# Patient Record
Sex: Male | Born: 1960 | Race: White | Hispanic: No | Marital: Single | Smoking: Former smoker
Health system: Southern US, Community
[De-identification: ages and names within clinical notes are randomized; demographics above are authoritative.]

## PROBLEM LIST (undated history)

## (undated) DIAGNOSIS — H524 Presbyopia: Secondary | ICD-10-CM

## (undated) DIAGNOSIS — K30 Functional dyspepsia: Secondary | ICD-10-CM

## (undated) DIAGNOSIS — L84 Corns and callosities: Secondary | ICD-10-CM

## (undated) DIAGNOSIS — E559 Vitamin D deficiency, unspecified: Secondary | ICD-10-CM

## (undated) DIAGNOSIS — M79676 Pain in unspecified toe(s): Secondary | ICD-10-CM

## (undated) DIAGNOSIS — R05 Cough: Secondary | ICD-10-CM

## (undated) DIAGNOSIS — B351 Tinea unguium: Secondary | ICD-10-CM

## (undated) DIAGNOSIS — E785 Hyperlipidemia, unspecified: Secondary | ICD-10-CM

## (undated) DIAGNOSIS — R042 Hemoptysis: Secondary | ICD-10-CM

## (undated) DIAGNOSIS — I10 Essential (primary) hypertension: Secondary | ICD-10-CM

## (undated) DIAGNOSIS — J310 Chronic rhinitis: Secondary | ICD-10-CM

## (undated) DIAGNOSIS — R059 Cough, unspecified: Secondary | ICD-10-CM

## (undated) DIAGNOSIS — H0019 Chalazion unspecified eye, unspecified eyelid: Secondary | ICD-10-CM

## (undated) DIAGNOSIS — K122 Cellulitis and abscess of mouth: Secondary | ICD-10-CM

## (undated) DIAGNOSIS — M1990 Unspecified osteoarthritis, unspecified site: Secondary | ICD-10-CM

## (undated) DIAGNOSIS — M2550 Pain in unspecified joint: Secondary | ICD-10-CM

## (undated) DIAGNOSIS — I519 Heart disease, unspecified: Secondary | ICD-10-CM

## (undated) DIAGNOSIS — K219 Gastro-esophageal reflux disease without esophagitis: Secondary | ICD-10-CM

## (undated) DIAGNOSIS — I2510 Atherosclerotic heart disease of native coronary artery without angina pectoris: Secondary | ICD-10-CM

---
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
## 2014-09-18 DIAGNOSIS — I213 ST elevation (STEMI) myocardial infarction of unspecified site: Secondary | ICD-10-CM

## 2014-09-18 HISTORY — PX: CORONARY STENT PLACEMENT: SHX1402

## 2014-09-18 HISTORY — DX: ST elevation (STEMI) myocardial infarction of unspecified site: I21.3

## 2019-02-14 ENCOUNTER — Other Ambulatory Visit: Payer: Self-pay

## 2019-02-14 ENCOUNTER — Inpatient Hospital Stay (HOSPITAL_COMMUNITY)
Admission: EM | Admit: 2019-02-14 | Discharge: 2019-02-17 | DRG: 392 | Attending: Family Medicine | Admitting: Family Medicine

## 2019-02-14 ENCOUNTER — Emergency Department (HOSPITAL_COMMUNITY)

## 2019-02-14 ENCOUNTER — Encounter (HOSPITAL_COMMUNITY): Payer: Self-pay | Admitting: Emergency Medicine

## 2019-02-14 DIAGNOSIS — K219 Gastro-esophageal reflux disease without esophagitis: Secondary | ICD-10-CM | POA: Diagnosis present

## 2019-02-14 DIAGNOSIS — Z79899 Other long term (current) drug therapy: Secondary | ICD-10-CM

## 2019-02-14 DIAGNOSIS — K5792 Diverticulitis of intestine, part unspecified, without perforation or abscess without bleeding: Secondary | ICD-10-CM | POA: Diagnosis present

## 2019-02-14 DIAGNOSIS — Z7902 Long term (current) use of antithrombotics/antiplatelets: Secondary | ICD-10-CM

## 2019-02-14 DIAGNOSIS — D62 Acute posthemorrhagic anemia: Secondary | ICD-10-CM | POA: Diagnosis present

## 2019-02-14 DIAGNOSIS — Z955 Presence of coronary angioplasty implant and graft: Secondary | ICD-10-CM

## 2019-02-14 DIAGNOSIS — R0602 Shortness of breath: Secondary | ICD-10-CM

## 2019-02-14 DIAGNOSIS — Z87891 Personal history of nicotine dependence: Secondary | ICD-10-CM

## 2019-02-14 DIAGNOSIS — K5732 Diverticulitis of large intestine without perforation or abscess without bleeding: Secondary | ICD-10-CM | POA: Diagnosis not present

## 2019-02-14 DIAGNOSIS — K6389 Other specified diseases of intestine: Secondary | ICD-10-CM | POA: Diagnosis present

## 2019-02-14 DIAGNOSIS — Z7982 Long term (current) use of aspirin: Secondary | ICD-10-CM

## 2019-02-14 DIAGNOSIS — I2510 Atherosclerotic heart disease of native coronary artery without angina pectoris: Secondary | ICD-10-CM | POA: Diagnosis present

## 2019-02-14 DIAGNOSIS — I10 Essential (primary) hypertension: Secondary | ICD-10-CM | POA: Diagnosis present

## 2019-02-14 DIAGNOSIS — I252 Old myocardial infarction: Secondary | ICD-10-CM

## 2019-02-14 DIAGNOSIS — D509 Iron deficiency anemia, unspecified: Secondary | ICD-10-CM | POA: Diagnosis present

## 2019-02-14 DIAGNOSIS — Z20828 Contact with and (suspected) exposure to other viral communicable diseases: Secondary | ICD-10-CM | POA: Diagnosis present

## 2019-02-14 DIAGNOSIS — E785 Hyperlipidemia, unspecified: Secondary | ICD-10-CM | POA: Diagnosis present

## 2019-02-14 DIAGNOSIS — D649 Anemia, unspecified: Secondary | ICD-10-CM | POA: Diagnosis present

## 2019-02-14 HISTORY — DX: Chronic rhinitis: J31.0

## 2019-02-14 HISTORY — DX: Functional dyspepsia: K30

## 2019-02-14 HISTORY — DX: Presbyopia: H52.4

## 2019-02-14 HISTORY — DX: Chalazion unspecified eye, unspecified eyelid: H00.19

## 2019-02-14 HISTORY — DX: Gastro-esophageal reflux disease without esophagitis: K21.9

## 2019-02-14 HISTORY — DX: Pain in unspecified toe(s): M79.676

## 2019-02-14 HISTORY — DX: Corns and callosities: L84

## 2019-02-14 HISTORY — DX: Tinea unguium: B35.1

## 2019-02-14 HISTORY — DX: Vitamin D deficiency, unspecified: E55.9

## 2019-02-14 HISTORY — DX: Heart disease, unspecified: I51.9

## 2019-02-14 HISTORY — DX: Hyperlipidemia, unspecified: E78.5

## 2019-02-14 HISTORY — DX: Essential (primary) hypertension: I10

## 2019-02-14 HISTORY — DX: Pain in unspecified joint: M25.50

## 2019-02-14 HISTORY — DX: Atherosclerotic heart disease of native coronary artery without angina pectoris: I25.10

## 2019-02-14 HISTORY — DX: Hemoptysis: R04.2

## 2019-02-14 HISTORY — DX: Cough, unspecified: R05.9

## 2019-02-14 HISTORY — DX: Unspecified osteoarthritis, unspecified site: M19.90

## 2019-02-14 HISTORY — DX: Cellulitis and abscess of mouth: K12.2

## 2019-02-14 LAB — COMPREHENSIVE METABOLIC PANEL
ALT: 17 U/L (ref 0–44)
AST: 21 U/L (ref 15–41)
Albumin: 3.6 g/dL (ref 3.5–5.0)
Alkaline Phosphatase: 98 U/L (ref 38–126)
Anion gap: 12 (ref 5–15)
BUN: 21 mg/dL — ABNORMAL HIGH (ref 6–20)
CO2: 23 mmol/L (ref 22–32)
Calcium: 9.4 mg/dL (ref 8.9–10.3)
Chloride: 102 mmol/L (ref 98–111)
Creatinine, Ser: 1.15 mg/dL (ref 0.61–1.24)
GFR calc Af Amer: >60 mL/min (ref >60)
GFR calc non Af Amer: >60 mL/min (ref >60)
Glucose, Bld: 147 mg/dL — ABNORMAL HIGH (ref 70–99)
Potassium: 4.6 mmol/L (ref 3.5–5.1)
Sodium: 137 mmol/L (ref 135–145)
Total Bilirubin: 0.6 mg/dL (ref 0.3–1.2)
Total Protein: 6.7 g/dL (ref 6.5–8.1)

## 2019-02-14 LAB — CBC
HCT: 29.6 % — ABNORMAL LOW (ref 39.0–52.0)
Hemoglobin: 7.9 g/dL — ABNORMAL LOW (ref 13.0–17.0)
MCH: 17.5 pg — ABNORMAL LOW (ref 26.0–34.0)
MCHC: 26.7 g/dL — ABNORMAL LOW (ref 30.0–36.0)
MCV: 65.6 fL — ABNORMAL LOW (ref 80.0–100.0)
Platelets: 384 10*3/uL (ref 150–400)
RBC: 4.51 MIL/uL (ref 4.22–5.81)
RDW: 19 % — ABNORMAL HIGH (ref 11.5–15.5)
WBC: 14.6 10*3/uL — ABNORMAL HIGH (ref 4.0–10.5)
nRBC: 0 % (ref 0.0–0.2)

## 2019-02-14 LAB — TROPONIN I: Troponin I: <0.03 ng/mL (ref <0.03)

## 2019-02-14 NOTE — ED Triage Notes (Signed)
[facility] - pt from [place] [facility]. C/O an episode of SOB, dizziness, nausea, abd pain, and an "odd taste" in his mouth earlier this afternoon. Pt has hx of MI with stent placement in 2016. Lung sounds clear per EMS, BP 100/67. EKG remarkable

## 2019-02-15 ENCOUNTER — Emergency Department (HOSPITAL_COMMUNITY)

## 2019-02-15 ENCOUNTER — Encounter (HOSPITAL_COMMUNITY): Payer: Self-pay

## 2019-02-15 DIAGNOSIS — Z87891 Personal history of nicotine dependence: Secondary | ICD-10-CM | POA: Diagnosis not present

## 2019-02-15 DIAGNOSIS — K5792 Diverticulitis of intestine, part unspecified, without perforation or abscess without bleeding: Secondary | ICD-10-CM

## 2019-02-15 DIAGNOSIS — Z20828 Contact with and (suspected) exposure to other viral communicable diseases: Secondary | ICD-10-CM | POA: Diagnosis present

## 2019-02-15 DIAGNOSIS — Z7982 Long term (current) use of aspirin: Secondary | ICD-10-CM | POA: Diagnosis not present

## 2019-02-15 DIAGNOSIS — K6389 Other specified diseases of intestine: Secondary | ICD-10-CM | POA: Diagnosis present

## 2019-02-15 DIAGNOSIS — Z79899 Other long term (current) drug therapy: Secondary | ICD-10-CM | POA: Diagnosis not present

## 2019-02-15 DIAGNOSIS — E785 Hyperlipidemia, unspecified: Secondary | ICD-10-CM | POA: Diagnosis present

## 2019-02-15 DIAGNOSIS — D62 Acute posthemorrhagic anemia: Secondary | ICD-10-CM | POA: Diagnosis present

## 2019-02-15 DIAGNOSIS — K219 Gastro-esophageal reflux disease without esophagitis: Secondary | ICD-10-CM | POA: Diagnosis present

## 2019-02-15 DIAGNOSIS — D649 Anemia, unspecified: Secondary | ICD-10-CM | POA: Diagnosis not present

## 2019-02-15 DIAGNOSIS — I10 Essential (primary) hypertension: Secondary | ICD-10-CM | POA: Diagnosis present

## 2019-02-15 DIAGNOSIS — K5732 Diverticulitis of large intestine without perforation or abscess without bleeding: Principal | ICD-10-CM

## 2019-02-15 DIAGNOSIS — I252 Old myocardial infarction: Secondary | ICD-10-CM | POA: Diagnosis not present

## 2019-02-15 DIAGNOSIS — I2510 Atherosclerotic heart disease of native coronary artery without angina pectoris: Secondary | ICD-10-CM | POA: Diagnosis present

## 2019-02-15 DIAGNOSIS — Z7902 Long term (current) use of antithrombotics/antiplatelets: Secondary | ICD-10-CM | POA: Diagnosis not present

## 2019-02-15 DIAGNOSIS — D509 Iron deficiency anemia, unspecified: Secondary | ICD-10-CM | POA: Diagnosis present

## 2019-02-15 DIAGNOSIS — Z955 Presence of coronary angioplasty implant and graft: Secondary | ICD-10-CM | POA: Diagnosis not present

## 2019-02-15 LAB — COMPREHENSIVE METABOLIC PANEL
ALT: 16 U/L (ref 0–44)
AST: 20 U/L (ref 15–41)
Albumin: 3.6 g/dL (ref 3.5–5.0)
Alkaline Phosphatase: 105 U/L (ref 38–126)
Anion gap: 11 (ref 5–15)
BUN: 18 mg/dL (ref 6–20)
CO2: 26 mmol/L (ref 22–32)
Calcium: 9.3 mg/dL (ref 8.9–10.3)
Chloride: 102 mmol/L (ref 98–111)
Creatinine, Ser: 0.99 mg/dL (ref 0.61–1.24)
GFR calc Af Amer: >60 mL/min (ref >60)
GFR calc non Af Amer: >60 mL/min (ref >60)
Glucose, Bld: 114 mg/dL — ABNORMAL HIGH (ref 70–99)
Potassium: 3.9 mmol/L (ref 3.5–5.1)
Sodium: 139 mmol/L (ref 135–145)
Total Bilirubin: 0.5 mg/dL (ref 0.3–1.2)
Total Protein: 6.6 g/dL (ref 6.5–8.1)

## 2019-02-15 LAB — URINALYSIS, ROUTINE W REFLEX MICROSCOPIC
Bilirubin Urine: NEGATIVE
Glucose, UA: NEGATIVE mg/dL
Hgb urine dipstick: NEGATIVE
Ketones, ur: NEGATIVE mg/dL
Leukocytes,Ua: NEGATIVE
Nitrite: NEGATIVE
Protein, ur: NEGATIVE mg/dL
Specific Gravity, Urine: 1.01 (ref 1.005–1.030)
pH: 5.5 (ref 5.0–8.0)

## 2019-02-15 LAB — SARS CORONAVIRUS 2 BY RT PCR (HOSPITAL ORDER, PERFORMED IN ~~LOC~~ HOSPITAL LAB): SARS Coronavirus 2: NEGATIVE

## 2019-02-15 LAB — IRON AND TIBC
Iron: 12 ug/dL — ABNORMAL LOW (ref 45–182)
Saturation Ratios: 3 % — ABNORMAL LOW (ref 17.9–39.5)
TIBC: 446 ug/dL (ref 250–450)
UIBC: 434 ug/dL

## 2019-02-15 LAB — OCCULT BLOOD X 1 CARD TO LAB, STOOL: Fecal Occult Bld: POSITIVE — AB

## 2019-02-15 LAB — RETICULOCYTES
Immature Retic Fract: 27.6 % — ABNORMAL HIGH (ref 2.3–15.9)
RBC.: 4.44 MIL/uL (ref 4.22–5.81)
Retic Count, Absolute: 74.6 10*3/uL (ref 19.0–186.0)
Retic Ct Pct: 1.7 % (ref 0.4–3.1)

## 2019-02-15 LAB — TYPE AND SCREEN
ABO/RH(D): A POS
Antibody Screen: NEGATIVE

## 2019-02-15 LAB — VITAMIN B12: Vitamin B-12: 339 pg/mL (ref 180–914)

## 2019-02-15 LAB — MRSA PCR SCREENING: MRSA by PCR: NEGATIVE

## 2019-02-15 LAB — FERRITIN: Ferritin: 2 ng/mL — ABNORMAL LOW (ref 24–336)

## 2019-02-15 LAB — FOLATE: Folate: 13.1 ng/mL (ref >5.9)

## 2019-02-15 MED ORDER — ONDANSETRON HCL 4 MG/2ML IJ SOLN: 4.0000 mg | Freq: Four times a day (QID) | INTRAMUSCULAR | Status: DC | PRN

## 2019-02-15 MED ORDER — ASPIRIN 81 MG PO CHEW
81.0000 mg | CHEWABLE_TABLET | Freq: Every day | ORAL | Status: DC
  Administered 2019-02-15 – 2019-02-17 (×3): 81 mg via ORAL
  Filled 2019-02-15 (×3): qty 1

## 2019-02-15 MED ORDER — METOPROLOL SUCCINATE ER 25 MG PO TB24
25.0000 mg | ORAL_TABLET | Freq: Every day | ORAL | Status: DC
  Administered 2019-02-15 – 2019-02-17 (×3): 25 mg via ORAL
  Filled 2019-02-15 (×3): qty 1

## 2019-02-15 MED ORDER — ATORVASTATIN CALCIUM 40 MG PO TABS
80.0000 mg | ORAL_TABLET | Freq: Every day | ORAL | Status: DC
  Administered 2019-02-15 – 2019-02-17 (×3): 80 mg via ORAL
  Filled 2019-02-15 (×3): qty 2

## 2019-02-15 MED ORDER — CLOPIDOGREL BISULFATE 75 MG PO TABS: 75.0000 mg | ORAL_TABLET | Freq: Every day | ORAL | Status: DC

## 2019-02-15 MED ORDER — SODIUM CHLORIDE 0.9 % IV SOLN
2.0000 g | INTRAVENOUS | Status: DC
  Administered 2019-02-16 – 2019-02-17 (×2): 2 g via INTRAVENOUS
  Filled 2019-02-15 (×2): qty 20

## 2019-02-15 MED ORDER — SODIUM CHLORIDE 0.9 % IV SOLN
INTRAVENOUS | Status: DC
  Administered 2019-02-15: 04:00:00 via INTRAVENOUS
  Administered 2019-02-16 (×2): 75 mL/h via INTRAVENOUS

## 2019-02-15 MED ORDER — METRONIDAZOLE IN NACL 5-0.79 MG/ML-% IV SOLN
500.0000 mg | Freq: Once | INTRAVENOUS | Status: AC
  Administered 2019-02-15: 500 mg via INTRAVENOUS
  Filled 2019-02-15: qty 100

## 2019-02-15 MED ORDER — ACETAMINOPHEN 325 MG PO TABS: 650.0000 mg | ORAL_TABLET | Freq: Four times a day (QID) | ORAL | Status: DC | PRN

## 2019-02-15 MED ORDER — ENSURE ENLIVE PO LIQD
237.0000 mL | Freq: Two times a day (BID) | ORAL | Status: DC
  Administered 2019-02-16 – 2019-02-17 (×3): 237 mL via ORAL

## 2019-02-15 MED ORDER — ONDANSETRON HCL 4 MG PO TABS
4.0000 mg | ORAL_TABLET | Freq: Four times a day (QID) | ORAL | Status: DC | PRN
  Administered 2019-02-16: 4 mg via ORAL
  Filled 2019-02-15: qty 1

## 2019-02-15 MED ORDER — SODIUM CHLORIDE 0.9 % IV SOLN
2.0000 g | Freq: Once | INTRAVENOUS | Status: AC
  Administered 2019-02-15: 2 g via INTRAVENOUS
  Filled 2019-02-15: qty 20

## 2019-02-15 MED ORDER — IOHEXOL 300 MG/ML  SOLN
100.0000 mL | Freq: Once | INTRAMUSCULAR | Status: AC | PRN
  Administered 2019-02-15: 100 mL via INTRAVENOUS

## 2019-02-15 MED ORDER — METRONIDAZOLE IN NACL 5-0.79 MG/ML-% IV SOLN
500.0000 mg | Freq: Three times a day (TID) | INTRAVENOUS | Status: DC
  Administered 2019-02-15 – 2019-02-17 (×6): 500 mg via INTRAVENOUS
  Filled 2019-02-15 (×7): qty 100

## 2019-02-15 MED ORDER — ENOXAPARIN SODIUM 40 MG/0.4ML ~~LOC~~ SOLN: 40.0000 mg | SUBCUTANEOUS | Status: DC

## 2019-02-15 MED ORDER — SPIRONOLACTONE 25 MG PO TABS
25.0000 mg | ORAL_TABLET | Freq: Every day | ORAL | Status: DC
  Administered 2019-02-15 – 2019-02-17 (×3): 25 mg via ORAL
  Filled 2019-02-15 (×3): qty 1

## 2019-02-15 MED ORDER — ISOSORBIDE DINITRATE 20 MG PO TABS
30.0000 mg | ORAL_TABLET | Freq: Four times a day (QID) | ORAL | Status: DC
  Administered 2019-02-15 – 2019-02-17 (×7): 30 mg via ORAL
  Filled 2019-02-15 (×8): qty 2

## 2019-02-15 NOTE — ED Notes (Signed)
ED Provider at bedside. 

## 2019-02-15 NOTE — Progress Notes (Signed)
Patient seen and evaluated, chart reviewed, please see EMR for updated orders. Please see full H&P dictated by admitting physician Dr Darrick Meigs for same date of service.    CT Abd/Pelvis IMPRESSION: 1. Short-segment wall thickening in mid sigmoid colon, with minimal pericolonic hazy opacity. Differential diagnosis includes sigmoid diverticulitis and colon carcinoma. Consider further evaluation with colonoscopy or short-term follow-up CT in 3-4 weeks. 2. No evidence of abscess or bowel obstruction.    D/w Gi service... Plan is to treat diverticulitis with antibiotics for now with possible endoluminal evaluation/colonoscopy in 3 to 4 weeks--- continue IV Rocephin and Flagyl    Patient seen and evaluated, chart reviewed, please see EMR for updated orders. Please see full H&P dictated by admitting physician Dr Darrick Meigs for same date of service.

## 2019-02-15 NOTE — ED Provider Notes (Signed)
[facility] EMERGENCY DEPARTMENT Provider Note   CSN: [id number] Arrival date & time: 02/14/19  2203    History   Chief Complaint Chief Complaint  Patient presents with  . Shortness of Breath    HPI Walter Allen is a 58 y.o. male.     The history is provided by the patient.  Abdominal Pain  Pain location:  LLQ and RLQ Pain quality: aching   Pain severity:  Moderate Onset quality:  Sudden Duration:  8 hours Timing:  Constant Progression:  Worsening Chronicity:  New Relieved by:  Nothing Worsened by:  Nothing Associated symptoms: cough, diarrhea, nausea and shortness of breath   Associated symptoms: no chest pain, no hematemesis, no hematochezia and no vomiting   [facility].  He has a previous history of CAD. He reports approximately 8 hours ago he had onset of abdominal cramping and diarrhea.  He also reported shortness of breath and felt clammy.  No chest pain.  He does report cough. He continues to have abdominal pain.  Past Medical History:  Diagnosis Date  . Abscess of mouth   . Arthritis   . Cellulitis of mouth   . Chalazion   . Chronic rhinitis   . Corns and callosities   . Cough   . Functional dyspepsia   . Heart disease    severe left ventricular systolic dysfunction after anterior MI  . Hemoptysis   . Hyperlipidemia   . Hypertension   . Joint pain   . Presbyopia   . STEMI (ST elevation myocardial infarction) ([place])   . Tinea unguium   . Toe pain   . Vitamin D deficiency     There are no active problems to display for this patient.   Past Surgical History:  Procedure Laterality Date  . CORONARY STENT PLACEMENT          Home Medications    Prior to Admission medications   Medication Sig Start Date End Date Taking? Authorizing Provider  acetaminophen (TYLENOL) 325 MG tablet Take 650 mg by mouth every 6 (six) hours as needed.   Yes [provider]  aspirin 81 MG chewable tablet Chew by mouth daily.   Yes  [provider]  atorvastatin (LIPITOR) 80 MG tablet Take 80 mg by mouth daily.   Yes [provider]  cetirizine (ZYRTEC) 10 MG tablet Take 10 mg by mouth daily.   Yes [provider]  clopidogrel (PLAVIX) 75 MG tablet Take 75 mg by mouth daily.   Yes [provider]  furosemide (LASIX) 20 MG tablet Take 20 mg by mouth.   Yes [provider]  isosorbide dinitrate (ISORDIL) 30 MG tablet Take 30 mg by mouth 4 (four) times daily.   Yes [provider]  metoprolol succinate (TOPROL-XL) 25 MG 24 hr tablet Take 25 mg by mouth daily.   Yes [provider]  nitroGLYCERIN (NITROSTAT) 0.4 MG SL tablet Place 0.4 mg under the tongue every 5 (five) minutes as needed for chest pain.   Yes [provider]  omeprazole (PRILOSEC) 20 MG capsule Take 20 mg by mouth daily.   Yes [provider]  spironolactone (ALDACTONE) 25 MG tablet Take 25 mg by mouth daily.   Yes [provider]  triamcinolone (KENALOG) 0.147 MG/GM topical spray Apply topically 2 (two) times daily.   Yes [provider]    Family History No family history on file.  Social History Social History   Tobacco Use  .  Smoking status: Former Smoker    Types: Cigarettes    Last attempt to quit: 02/13/2006    Years since quitting: 13.0  Substance Use Topics  . Alcohol use: Never    Frequency: Never  . Drug use: Never     Allergies   Collard greens [lactuca virosa]   Review of Systems Review of Systems  Respiratory: Positive for cough and shortness of breath.   Cardiovascular: Negative for chest pain.  Gastrointestinal: Positive for abdominal pain, diarrhea and nausea. Negative for blood in stool, hematemesis, hematochezia and vomiting.  All other systems reviewed and are negative.    Physical Exam Updated Vital Signs BP 104/69   Pulse 72   Temp 98.3 F (36.8 C) (Oral)   Resp 14   Ht 1.753 m (5\' 9" )   Wt 93.9 kg   SpO2 99%    BMI 30.57 kg/m   Physical Exam CONSTITUTIONAL: Elderly, pale HEAD: Normocephalic/atraumatic EYES: EOMI/PERRL, conjunctiva pale ENMT: Mucous membranes moist NECK: supple no meningeal signs SPINE/BACK:entire spine nontender CV: S1/S2 noted, no murmurs/rubs/gallops noted LUNGS: Lungs are clear to auscultation bilaterally, no apparent distress ABDOMEN: soft, mild LLQ and RLQ tenderness, no rebound or guarding, bowel sounds noted throughout abdomen GU:no cva tenderness Rectal-brown stool noted, no blood or melena, Hemoccult negative, nurse present NEURO: Pt is awake/alert/appropriate, moves all extremitiesx4.  No facial droop.   EXTREMITIES: pulses normal/equal, full ROM SKIN: Pale PSYCH: no abnormalities of mood noted, alert and oriented to situation   ED Treatments / Results  Labs (all labs ordered are listed, but only abnormal results are displayed) Labs Reviewed  CBC - Abnormal; Notable for the following components:      Result Value   WBC 14.6 (*)    Hemoglobin 7.9 (*)    HCT 29.6 (*)    MCV 65.6 (*)    MCH 17.5 (*)    MCHC 26.7 (*)    RDW 19.0 (*)    All other components within normal limits  COMPREHENSIVE METABOLIC PANEL - Abnormal; Notable for the following components:   Glucose, Bld 147 (*)    BUN 21 (*)    All other components within normal limits  SARS CORONAVIRUS 2 (HOSPITAL ORDER, [facility])  TROPONIN I  URINALYSIS, ROUTINE W REFLEX MICROSCOPIC  TYPE AND SCREEN    EKG EKG Interpretation  Date/Time:  Friday Feb 14 2019 22:12:09 EDT Ventricular Rate:  71 PR Interval:    QRS Duration: 93 QT Interval:  387 QTC Calculation: 421 R Axis:   35 Text Interpretation:  Sinus rhythm No previous ECGs available Confirmed by Ripley Fraise [phone]) on 02/14/2019 11:08:58 PM   Radiology Ct Abdomen Pelvis W Contrast  Result Date: 02/15/2019 CLINICAL DATA:  Lower abdominal pain and nausea beginning today. EXAM: CT ABDOMEN AND PELVIS WITH  CONTRAST TECHNIQUE: Multidetector CT imaging of the abdomen and pelvis was performed using the standard protocol following bolus administration of intravenous contrast. CONTRAST:  164mL OMNIPAQUE IOHEXOL 300 MG/ML  SOLN COMPARISON:  None. FINDINGS: Lower Chest: No acute findings. Hepatobiliary: No hepatic masses identified. Gallbladder is unremarkable. Pancreas:  No mass or inflammatory changes. Spleen: Within normal limits in size and appearance. Adrenals/Urinary Tract: No masses identified. No evidence of hydronephrosis. Stomach/Bowel: A short-segment area of wall thickening is seen in the mid sigmoid colon with minimal hazy opacity in the adjacent pericolonic fat. This could represent sigmoid diverticulitis or colon carcinoma. No evidence of abscess or bowel obstruction. Vascular/Lymphatic: No pathologically enlarged lymph nodes.  No abdominal aortic aneurysm. Aortic atherosclerosis. Reproductive:  No mass or other significant abnormality. Other:  None. Musculoskeletal:  No suspicious bone lesions identified. IMPRESSION: 1. Short-segment wall thickening in mid sigmoid colon, with minimal pericolonic hazy opacity. Differential diagnosis includes sigmoid diverticulitis and colon carcinoma. Consider further evaluation with colonoscopy or short-term follow-up CT in 3-4 weeks. 2. No evidence of abscess or bowel obstruction. Aortic Atherosclerosis (ICD10-I70.0). Electronically Signed   By: Earle Gell M.D.   On: 02/15/2019 01:52   Dg Chest Port 1 View  Result Date: 02/14/2019 CLINICAL DATA:  Shortness of breath and dizziness EXAM: PORTABLE CHEST 1 VIEW COMPARISON:  None. FINDINGS: The heart size and mediastinal contours are within normal limits. Both lungs are clear. The visualized skeletal structures are unremarkable. IMPRESSION: No active disease. Electronically Signed   By: Donavan Foil M.D.   On: 02/14/2019 23:25    Procedures Procedures (including critical care time)  Medications Ordered in ED  Medications - No data to display   Initial Impression / Assessment and Plan / ED Course  I have reviewed the triage vital signs and the nursing notes.  Pertinent labs & imaging results that were available during my care of the patient were reviewed by me and considered in my medical decision making (see chart for details).        12:11 AM Patient presents from prison for abdominal pain, diarrhea and feeling short of breath.  He appears to be anemic, unclear of chronicity.  We have no old records on patient he was unaware of this.  No signs of acute GI bleed.  Though he does still have abdominal pain, will proceed with CT imaging 2:59 AM Pt found to have diverticulitis vs colon mass In the setting of possible infection as well as acute anemia, I feel patient would benefit from admission He will likely need colonoscopy once his infection is treated.  His anemia can be monitored in the hospital. Patient is agreeable with plan.  Apparently he can get specialty care through the prison after discharge  Discussed with Dr. Darrick Meigs for admission Final Clinical Impressions(s) / ED Diagnoses   Final diagnoses:  SOB (shortness of breath)  Acute anemia  Sigmoid diverticulitis    ED Discharge Orders    None       Ripley Fraise, MD 02/15/19 0300

## 2019-02-15 NOTE — ED Notes (Signed)
Patient transported to CT 

## 2019-02-15 NOTE — Consult Note (Signed)
Referring Provider: Dr. Darrick Meigs Primary Care Physician:  Patient, No Pcp Per Primary Gastroenterologist:  Dr. Gala Romney   Date of Admission: 02/14/19 Date of Consultation: 02/15/19  Reason for Consultation:  Diverticulitis  HPI:  Walter Allen is a 58 y.o. year old male who presented to the ED from [employer] [facility] due to abdominal pain, diarrhea. CT abd/pelvis with contrast with short-segment wall thickening in mid sigmoid colon with minimal pericolonic hazy opacity, with differentials to include diverticulitis, unable to exclude carcinoma. Found to have IDA with Hgb 7.9, ferritin 2. Leukocytosis with WBC count 14.6.   States around 415 yesterday, he became dizzy, diaphoretic. Felt constipated. Had been dealing with constipation for about 4 days prior, then started having diarrhea. Associated lower abdomina pain, intermittent and relieved with BM. Stool with dark pink tinge today. Watery. Pain across lower abdomen, acute onset. Pain improved until needs to have a BM, then will have pressure and pain.  One stool every 1-2 hours. Watery with small pieces.  No prior issues with constipation. +nausea no vomiting. Nausea resolved now. No fever.   Having leg cramps recently. Around 3pm will have a bitter taste in mouth. Not related to eating/drinking. Starts off being bitter and then becomes very sweet.   No family history of colon cancer. No family history of colon polyps. Has not spoken to family in 35-40 years. No prior colonoscopy.    Chronic GERD: was taking Prilosec prior to admission. No dysphagia. No unexplained weight loss or lack of appetite.   He states returning for outpatient colonoscopy should not be difficult as long as this is recommended. Guard at bedside.   Past Medical History:  Diagnosis Date  . Abscess of mouth   . Arthritis   . Cellulitis of mouth   . Chalazion   . Chronic rhinitis   . Corns and callosities   . Coronary artery disease   . Cough   . Functional  dyspepsia   . GERD (gastroesophageal reflux disease)   . Heart disease    severe left ventricular systolic dysfunction after anterior MI  . Hemoptysis   . Hyperlipidemia   . Hypertension   . Joint pain   . Presbyopia   . STEMI (ST elevation myocardial infarction) ([place])   . Tinea unguium   . Toe pain   . Vitamin D deficiency     Past Surgical History:  Procedure Laterality Date  . CORONARY STENT PLACEMENT  2016    Prior to Admission medications   Medication Sig Start Date End Date Taking? Authorizing Provider  acetaminophen (TYLENOL) 325 MG tablet Take 650 mg by mouth every 6 (six) hours as needed.   Yes [provider]  aspirin 81 MG chewable tablet Chew by mouth daily.   Yes [provider]  atorvastatin (LIPITOR) 80 MG tablet Take 80 mg by mouth daily.   Yes [provider]  cetirizine (ZYRTEC) 10 MG tablet Take 10 mg by mouth daily.   Yes [provider]  clopidogrel (PLAVIX) 75 MG tablet Take 75 mg by mouth daily.   Yes [provider]  furosemide (LASIX) 20 MG tablet Take 20 mg by mouth.   Yes [provider]  isosorbide dinitrate (ISORDIL) 30 MG tablet Take 30 mg by mouth 4 (four) times daily.   Yes [provider]  metoprolol succinate (TOPROL-XL) 25 MG 24 hr tablet Take 25 mg by mouth daily.   Yes [provider]  nitroGLYCERIN (NITROSTAT) 0.4 MG SL tablet Place  0.4 mg under the tongue every 5 (five) minutes as needed for chest pain.   Yes [provider]  omeprazole (PRILOSEC) 20 MG capsule Take 20 mg by mouth daily.   Yes [provider]  spironolactone (ALDACTONE) 25 MG tablet Take 25 mg by mouth daily.   Yes [provider]  triamcinolone (KENALOG) 0.147 MG/GM topical spray Apply topically 2 (two) times daily.   Yes [provider]    Current Facility-Administered Medications  Medication Dose Route Frequency Provider Last Rate Last Dose  . 0.9 %  sodium  chloride infusion   Intravenous Continuous Emokpae, Courage, MD 75 mL/hr at 02/15/19 1236    . acetaminophen (TYLENOL) tablet 650 mg  650 mg Oral Q6H PRN Oswald Hillock, MD      . aspirin chewable tablet 81 mg  81 mg Oral Daily Oswald Hillock, MD   81 mg at 02/15/19 1232  . atorvastatin (LIPITOR) tablet 80 mg  80 mg Oral Daily Oswald Hillock, MD   80 mg at 02/15/19 1228  . [START ON 02/16/2019] cefTRIAXone (ROCEPHIN) 2 g in sodium chloride 0.9 % 100 mL IVPB  2 g Intravenous Q24H [place], Gagan S, MD      . isosorbide dinitrate (ISORDIL) tablet 30 mg  30 mg Oral QID Oswald Hillock, MD   30 mg at 02/15/19 1229  . metoprolol succinate (TOPROL-XL) 24 hr tablet 25 mg  25 mg Oral Daily Oswald Hillock, MD   25 mg at 02/15/19 1237  . metroNIDAZOLE (FLAGYL) IVPB 500 mg  500 mg Intravenous Q8H Lama, Marge Duncans, MD      . ondansetron (ZOFRAN) tablet 4 mg  4 mg Oral Q6H PRN Oswald Hillock, MD       Or  . ondansetron (ZOFRAN) injection 4 mg  4 mg Intravenous Q6H PRN Oswald Hillock, MD      . spironolactone (ALDACTONE) tablet 25 mg  25 mg Oral Daily Oswald Hillock, MD   25 mg at 02/15/19 1232    Allergies as of 02/14/2019 - never reviewed  Allergen Reaction Noted  . Collard greens [lactuca virosa]  02/14/2019    Family History  Problem Relation Age of Onset  . Colon cancer Neg Hx   . Colon polyps Neg Hx     Social History   Socioeconomic History  . Marital status: Single    Spouse name: Not on file  . Number of children: Not on file  . Years of education: Not on file  . Highest education level: Some college, no degree  Occupational History  . Not on file  Social Needs  . Financial resource strain: Not on file  . Food insecurity:    Worry: Not on file    Inability: Not on file  . Transportation needs:    Medical: Not on file    Non-medical: Not on file  Tobacco Use  . Smoking status: Former Smoker    Types: Cigarettes    Last attempt to quit: 01/22/2006    Years since quitting: 13.0  . Smokeless  tobacco: Never Used  Substance and Sexual Activity  . Alcohol use: Never    Frequency: Never  . Drug use: Never  . Sexual activity: Never  Lifestyle  . Physical activity:    Days per week: Not on file    Minutes per session: Not on file  . Stress: Not on file  Relationships  . Social connections:    Talks on  phone: Not on file    Gets together: Not on file    Attends religious service: Not on file    Active member of club or organization: Not on file    Attends meetings of clubs or organizations: Not on file    Relationship status: Not on file  . Intimate partner violence:    Fear of current or ex partner: Not on file    Emotionally abused: Not on file    Physically abused: Not on file    Forced sexual activity: Not on file  Other Topics Concern  . Not on file  Social History Narrative  . Not on file    Review of Systems: Gen: see HPI CV: Denies chest pain, heart palpitations, syncope, edema  Resp: Denies shortness of breath with rest, cough, wheezing GI: see HPI GU : Denies urinary burning, urinary frequency, urinary incontinence.  MS: Denies joint pain,swelling, cramping Derm: Denies rash, itching, dry skin Psych: Baseline depression/anxiety Heme: see HPI  Physical Exam: Vital signs in last 24 hours: Temp:  [98 F (36.7 C)-98.3 F (36.8 C)] 98 F (36.7 C) (05/30 0411) Pulse Rate:  [58-72] 67 (05/30 0411) Resp:  [14-20] 16 (05/30 0411) BP: (95-126)/(64-77) 126/75 (05/30 0411) SpO2:  [98 %-100 %] 100 % (05/30 0411) Weight:  [93.9 kg] 93.9 kg (05/29 2207) Last BM Date: 02/15/19 General:   Alert,  Well-developed, well-nourished, pleasant and cooperative in NAD Head:  Normocephalic and atraumatic. Eyes:  Sclera clear, no icterus.   Conjunctiva pink. Ears:  Normal auditory acuity. Nose:  No deformity, discharge,  or lesions. Mouth:  No deformity or lesions, dentition normal. Lungs:  Clear throughout to auscultation.    Heart:  S1 S2 present, possible soft  systolic murmur  Abdomen:  Soft, mild TTP lower abdomen and nondistended. No masses, hepatosplenomegaly or hernias noted. Normal bowel sounds, without guarding, and without rebound.   Rectal:  Deferred until time of colonoscopy.   Msk:  Symmetrical without gross deformities. Normal posture. Extremities:  Without edema. Neurologic:  Alert and  oriented x4 Psych:  Alert and cooperative. Normal mood and affect.  Intake/Output from previous day: 05/29 0701 - 05/30 0700 In: 377.9 [I.V.:178.7; IV Piggyback:199.3] Out: -  Intake/Output this shift: No intake/output data recorded.  Lab Results: Recent Labs    02/14/19 2234  WBC 14.6*  HGB 7.9*  HCT 29.6*  PLT 384   BMET Recent Labs    02/14/19 2234 02/15/19 0627  NA 137 139  K 4.6 3.9  CL 102 102  CO2 23 26  GLUCOSE 147* 114*  BUN 21* 18  CREATININE 1.15 0.99  CALCIUM 9.4 9.3   LFT Recent Labs    02/14/19 2234 02/15/19 0627  PROT 6.7 6.6  ALBUMIN 3.6 3.6  AST 21 20  ALT 17 16  ALKPHOS 98 105  BILITOT 0.6 0.5    Studies/Results: Ct Abdomen Pelvis W Contrast  Result Date: 02/15/2019 CLINICAL DATA:  Lower abdominal pain and nausea beginning today. EXAM: CT ABDOMEN AND PELVIS WITH CONTRAST TECHNIQUE: Multidetector CT imaging of the abdomen and pelvis was performed using the standard protocol following bolus administration of intravenous contrast. CONTRAST:  18mL OMNIPAQUE IOHEXOL 300 MG/ML  SOLN COMPARISON:  None. FINDINGS: Lower Chest: No acute findings. Hepatobiliary: No hepatic masses identified. Gallbladder is unremarkable. Pancreas:  No mass or inflammatory changes. Spleen: Within normal limits in size and appearance. Adrenals/Urinary Tract: No masses identified. No evidence of hydronephrosis. Stomach/Bowel: A short-segment area of wall thickening is seen  in the mid sigmoid colon with minimal hazy opacity in the adjacent pericolonic fat. This could represent sigmoid diverticulitis or colon carcinoma. No evidence of  abscess or bowel obstruction. Vascular/Lymphatic: No pathologically enlarged lymph nodes. No abdominal aortic aneurysm. Aortic atherosclerosis. Reproductive:  No mass or other significant abnormality. Other:  None. Musculoskeletal:  No suspicious bone lesions identified. IMPRESSION: 1. Short-segment wall thickening in mid sigmoid colon, with minimal pericolonic hazy opacity. Differential diagnosis includes sigmoid diverticulitis and colon carcinoma. Consider further evaluation with colonoscopy or short-term follow-up CT in 3-4 weeks. 2. No evidence of abscess or bowel obstruction. Aortic Atherosclerosis (ICD10-I70.0). Electronically Signed   By: Earle Gell M.D.   On: 02/15/2019 01:52   Dg Chest Port 1 View  Result Date: 02/14/2019 CLINICAL DATA:  Shortness of breath and dizziness EXAM: PORTABLE CHEST 1 VIEW COMPARISON:  None. FINDINGS: The heart size and mediastinal contours are within normal limits. Both lungs are clear. The visualized skeletal structures are unremarkable. IMPRESSION: No active disease. Electronically Signed   By: Donavan Foil M.D.   On: 02/14/2019 23:25    Impression: 58 year old male presenting with abdominal pain, loose stool after bout with constipation, and CT noting short segment wall thickening in mid sigmoid colon and started on IV antibiotics for diverticulitis. Found to have microcytic anemia, with Hgb 7.9 and ferritin 2, consistent with IDA. Will need diagnostic colonoscopy once acute illness resolved to assess for occult neoplasm. For now, continue IV antibiotics, clear liquid diet and likely advance to soft diet in next 24 hours.   Plan: Continue IV antibiotics Clear liquids Follow H/H Colonoscopy as outpatient in next 3-4 weeks Likely will advance diet tomorrow Reassess in the morning   Annitta Needs, PhD, ANP-BC [facility] Gastro[facility]     LOS: 0 days    02/15/2019, 12:47 PM

## 2019-02-15 NOTE — ED Notes (Signed)
hospitalist at bedside

## 2019-02-15 NOTE — Plan of Care (Signed)
  Problem: Elimination: Goal: Will not experience complications related to urinary retention Outcome: Not Progressing   Problem: Nutrition: Goal: Adequate nutrition will be maintained Outcome: Not Progressing   Problem: Clinical Measurements: Goal: Cardiovascular complication will be avoided Outcome: Not Progressing

## 2019-02-15 NOTE — H&P (Addendum)
TRH H&P    Patient Demographics:    Walter Allen, is a 58 y.o. male  MRN: [id number]  DOB - November 27, 1960  Admit Date - 02/14/2019  Referring MD/NP/PA: Dr. Christy Gentles  Outpatient Primary MD for the patient is Patient, No Pcp Per  Patient coming from: [facility]  Chief complaint-abdominal pain   HPI:    Walter Allen  is a 58 y.o. male, with a history of CAD status post stent placement in 2016, hyperlipidemia, hypertension who is currently in prison was brought to the ED with chief complaint of abdominal pain and diarrhea which started around 4 PM yesterday.  Patient says that he was constipated for past few days and today he had multiple small BMs and after that he developed diarrhea.  He had 7-8 loose BMs today.  Denies blood in the stool.  Denies vomiting but only complaint of nausea.  Denies fever or chills. Also had shortness of breath and sweating episode. Denies chest pain. Denies weight loss. No previous history of cancer. Never had colonoscopy in the past. In the ED CT scan of the abdomen pelvis showed short segment wall thickening in the mid sigmoid colon with minimal pericolonic hazy opacity.  Patient started on ceftriaxone and Flagyl. CBC showed hemoglobin 7.9.     Review of systems:    In addition to the HPI above,    All other systems reviewed and are negative.    Past History of the following :    Past Medical History:  Diagnosis Date  . Abscess of mouth   . Arthritis   . Cellulitis of mouth   . Chalazion   . Chronic rhinitis   . Corns and callosities   . Cough   . Functional dyspepsia   . Heart disease    severe left ventricular systolic dysfunction after anterior MI  . Hemoptysis   . Hyperlipidemia   . Hypertension   . Joint pain   . Presbyopia   . STEMI (ST elevation myocardial infarction) ([place])   . Tinea unguium   . Toe pain   . Vitamin D deficiency       Past  Surgical History:  Procedure Laterality Date  . CORONARY STENT PLACEMENT        Social History:      Social History   Tobacco Use  . Smoking status: Former Smoker    Types: Cigarettes    Last attempt to quit: 02/13/2006    Years since quitting: 13.0  Substance Use Topics  . Alcohol use: Never    Frequency: Never       Family History :   Grandparents from both father mother side had heart disease.   Home Medications:   Prior to Admission medications   Medication Sig Start Date End Date Taking? Authorizing Provider  acetaminophen (TYLENOL) 325 MG tablet Take 650 mg by mouth every 6 (six) hours as needed.   Yes [provider]  aspirin 81 MG chewable tablet Chew by mouth daily.   Yes [provider]  atorvastatin (LIPITOR) 80 MG  tablet Take 80 mg by mouth daily.   Yes [provider]  cetirizine (ZYRTEC) 10 MG tablet Take 10 mg by mouth daily.   Yes [provider]  clopidogrel (PLAVIX) 75 MG tablet Take 75 mg by mouth daily.   Yes [provider]  furosemide (LASIX) 20 MG tablet Take 20 mg by mouth.   Yes [provider]  isosorbide dinitrate (ISORDIL) 30 MG tablet Take 30 mg by mouth 4 (four) times daily.   Yes [provider]  metoprolol succinate (TOPROL-XL) 25 MG 24 hr tablet Take 25 mg by mouth daily.   Yes [provider]  nitroGLYCERIN (NITROSTAT) 0.4 MG SL tablet Place 0.4 mg under the tongue every 5 (five) minutes as needed for chest pain.   Yes [provider]  omeprazole (PRILOSEC) 20 MG capsule Take 20 mg by mouth daily.   Yes [provider]  spironolactone (ALDACTONE) 25 MG tablet Take 25 mg by mouth daily.   Yes [provider]  triamcinolone (KENALOG) 0.147 MG/GM topical spray Apply topically 2 (two) times daily.   Yes [provider]     Allergies:     Allergies  Allergen Reactions  . [place] Rosanna Randy Virosa]      Physical Exam:    Vitals  Blood pressure 114/75, pulse 64, temperature 98.3 F (36.8 C), temperature source Oral, resp. rate 18, height 5\' 9"  (1.753 m), weight 93.9 kg, SpO2 100 %.  1.  General: Appears in no acute distress  2. Psychiatric: Alert, oriented x3, intact insight and judgment  3. Neurologic: Cranial nerves II through XII grossly intact, motor strength 5/5 in all extremities.  4. HEENMT:  Atraumatic normocephalic, extraocular muscles intact  5. Respiratory : Clear to auscultation bilaterally  6. Cardiovascular : S1-S2, regular, no murmur auscultated, no edema noted in the lower extremities  7. Gastrointestinal:  Abdomen is soft, tenderness in the left lower quadrant, no palpable masses.      Data Review:    CBC Recent Labs  Lab 02/14/19 2234  WBC 14.6*  HGB 7.9*  HCT 29.6*  PLT 384  MCV 65.6*  MCH 17.5*  MCHC 26.7*  RDW 19.0*   ------------------------------------------------------------------------------------------------------------------  Results for orders placed or performed during the hospital encounter of 02/14/19 (from the past 48 hour(s))  CBC     Status: Abnormal   Collection Time: 02/14/19 10:34 PM  Result Value Ref Range   WBC 14.6 (H) 4.0 - 10.5 K/uL   RBC 4.51 4.22 - 5.81 MIL/uL   Hemoglobin 7.9 (L) 13.0 - 17.0 g/dL    Comment: Reticulocyte Hemoglobin testing may be clinically indicated, consider ordering this additional test [id number]    HCT 29.6 (L) 39.0 - 52.0 %   MCV 65.6 (L) 80.0 - 100.0 fL   MCH 17.5 (L) 26.0 - 34.0 pg   MCHC 26.7 (L) 30.0 - 36.0 g/dL   RDW 19.0 (H) 11.5 - 15.5 %   Platelets 384 150 - 400 K/uL   nRBC 0.0 0.0 - 0.2 %    Comment: Performed at [facility] [address] [place], [place] [address]  Comprehensive metabolic panel     Status: Abnormal   Collection Time: 02/14/19 10:34 PM  Result Value Ref Range   Sodium 137 135 - 145 mmol/L   Potassium 4.6 3.5 - 5.1 mmol/L   Chloride 102 98 - 111 mmol/L   CO2 23 22 - 32  mmol/L   Glucose, Bld 147 (H) 70 - 99  mg/dL   BUN 21 (H) 6 - 20 mg/dL   Creatinine, Ser 1.15 0.61 - 1.24 mg/dL   Calcium 9.4 8.9 - 10.3 mg/dL   Total Protein 6.7 6.5 - 8.1 g/dL   Albumin 3.6 3.5 - 5.0 g/dL   AST 21 15 - 41 U/L   ALT 17 0 - 44 U/L   Alkaline Phosphatase 98 38 - 126 U/L   Total Bilirubin 0.6 0.3 - 1.2 mg/dL   GFR calc non Af Amer >60 >60 mL/min   GFR calc Af Amer >60 >60 mL/min   Anion gap 12 5 - 15    Comment: Performed at [facility], [address], [place], [place] [address]  Troponin I - ONCE - STAT     Status: None   Collection Time: 02/14/19 10:34 PM  Result Value Ref Range   Troponin I <0.03 <0.03 ng/mL    Comment: Performed at [facility] [address], [place], [place] [address]  Type and screen     Status: None   Collection Time: 02/15/19 12:24 AM  Result Value Ref Range   ABO/RH(D) A POS    Antibody Screen NEG    Sample Expiration      02/18/2019,2359 Performed at [facility], [address], [place], [place] [address]   Urinalysis, Routine w reflex microscopic     Status: None   Collection Time: 02/15/19  1:29 AM  Result Value Ref Range   Color, Urine YELLOW YELLOW   APPearance CLEAR CLEAR   Specific Gravity, Urine 1.010 1.005 - 1.030   pH 5.5 5.0 - 8.0   Glucose, UA NEGATIVE NEGATIVE mg/dL   Hgb urine dipstick NEGATIVE NEGATIVE   Bilirubin Urine NEGATIVE NEGATIVE   Ketones, ur NEGATIVE NEGATIVE mg/dL   Protein, ur NEGATIVE NEGATIVE mg/dL   Nitrite NEGATIVE NEGATIVE   Leukocytes,Ua NEGATIVE NEGATIVE    Comment: Microscopic not done on urines with negative protein, blood, leukocytes, nitrite, or glucose < 500 mg/dL. Performed at [facility], [address], [place], [place] [address]     Chemistries  Recent Labs  Lab 02/14/19 2234  NA 137  K 4.6  CL 102  CO2 23  GLUCOSE 147*  BUN 21*  CREATININE 1.15  CALCIUM 9.4  AST 21  ALT 17  ALKPHOS 98  BILITOT 0.6    ------------------------------------------------------------------------------------------------------------------  ------------------------------------------------------------------------------------------------------------------ GFR: Estimated Creatinine Clearance: 77.3 mL/min (by C-G formula based on SCr of 1.15 mg/dL). Liver Function Tests: Recent Labs  Lab 02/14/19 2234  AST 21  ALT 17  ALKPHOS 98  BILITOT 0.6  PROT 6.7  ALBUMIN 3.6   No results for input(s): LIPASE, AMYLASE in the last 168 hours. No results for input(s): AMMONIA in the last 168 hours. Coagulation Profile: No results for input(s): INR, PROTIME in the last 168 hours. Cardiac Enzymes: Recent Labs  Lab 02/14/19 2234  TROPONINI <0.03   BNP (last 3 results) No results for input(s): PROBNP in the last 8760 hours. HbA1C: No results for input(s): HGBA1C in the last 72 hours. CBG: No results for input(s): GLUCAP in the last 168 hours. Lipid Profile: No results for input(s): CHOL, HDL, LDLCALC, TRIG, CHOLHDL, LDLDIRECT in the last 72 hours. Thyroid Function Tests: No results for input(s): TSH, T4TOTAL, FREET4, T3FREE, THYROIDAB in the last 72 hours. Anemia Panel: No results for input(s): VITAMINB12, FOLATE, FERRITIN, TIBC, IRON, RETICCTPCT in the last 72 hours.  --------------------------------------------------------------------------------------------------------------- Urine analysis:    Component Value Date/Time   COLORURINE YELLOW 02/15/2019 0129  APPEARANCEUR CLEAR 02/15/2019 0129   LABSPEC 1.010 02/15/2019 0129   PHURINE 5.5 02/15/2019 0129   GLUCOSEU NEGATIVE 02/15/2019 0129   HGBUR NEGATIVE 02/15/2019 0129   BILIRUBINUR NEGATIVE 02/15/2019 0129   KETONESUR NEGATIVE 02/15/2019 0129   PROTEINUR NEGATIVE 02/15/2019 0129   NITRITE NEGATIVE 02/15/2019 0129   LEUKOCYTESUR NEGATIVE 02/15/2019 0129      Imaging Results:    Ct Abdomen Pelvis W Contrast  Result Date: 02/15/2019 CLINICAL DATA:   Lower abdominal pain and nausea beginning today. EXAM: CT ABDOMEN AND PELVIS WITH CONTRAST TECHNIQUE: Multidetector CT imaging of the abdomen and pelvis was performed using the standard protocol following bolus administration of intravenous contrast. CONTRAST:  180mL OMNIPAQUE IOHEXOL 300 MG/ML  SOLN COMPARISON:  None. FINDINGS: Lower Chest: No acute findings. Hepatobiliary: No hepatic masses identified. Gallbladder is unremarkable. Pancreas:  No mass or inflammatory changes. Spleen: Within normal limits in size and appearance. Adrenals/Urinary Tract: No masses identified. No evidence of hydronephrosis. Stomach/Bowel: A short-segment area of wall thickening is seen in the mid sigmoid colon with minimal hazy opacity in the adjacent pericolonic fat. This could represent sigmoid diverticulitis or colon carcinoma. No evidence of abscess or bowel obstruction. Vascular/Lymphatic: No pathologically enlarged lymph nodes. No abdominal aortic aneurysm. Aortic atherosclerosis. Reproductive:  No mass or other significant abnormality. Other:  None. Musculoskeletal:  No suspicious bone lesions identified. IMPRESSION: 1. Short-segment wall thickening in mid sigmoid colon, with minimal pericolonic hazy opacity. Differential diagnosis includes sigmoid diverticulitis and colon carcinoma. Consider further evaluation with colonoscopy or short-term follow-up CT in 3-4 weeks. 2. No evidence of abscess or bowel obstruction. Aortic Atherosclerosis (ICD10-I70.0). Electronically Signed   By: Earle Gell M.D.   On: 02/15/2019 01:52   Dg Chest Port 1 View  Result Date: 02/14/2019 CLINICAL DATA:  Shortness of breath and dizziness EXAM: PORTABLE CHEST 1 VIEW COMPARISON:  None. FINDINGS: The heart size and mediastinal contours are within normal limits. Both lungs are clear. The visualized skeletal structures are unremarkable. IMPRESSION: No active disease. Electronically Signed   By: Donavan Foil M.D.   On: 02/14/2019 23:25    My personal  review of EKG: Rhythm NSR   Assessment & Plan:    Active Problems:   Diverticulitis   1. Sigmoid diverticulitis-CT scan shows short segment wall thickening in mid sigmoid colon with minimal pericolonic hazy opacity.  Concern for diverticulitis and colon carcinoma.  Will start ceftriaxone 2 g IV every 24 hours, Flagyl 500 mg IV every 8 hours.  Start clear liquid diet.  There is a concern for colon cancer, as patient also has anemia with hemoglobin 7.9.  Will consult GI.  Patient will need colonoscopy after treatment of sigmoid diverticulitis.  2. Anemia-MCV 65, hemoglobin is 7.9, likely has iron deficiency anemia.  Will check FOBT, anemia panel  3. CAD status post stent placement-continue Imdur, aspirin, Plavix, Toprol-XL.   DVT Prophylaxis- SCDs  AM Labs Ordered, also please review Full Orders  Family Communication: Admission, patients condition and plan of care including tests being ordered have been discussed with the patient  who indicate understanding and agree with the plan and Code Status.  Code Status: Full code  Admission status:Inpatient: Based on patients clinical presentation and evaluation of above clinical data, I have made determination that patient meets Inpatient criteria at this time.  Time spent in minutes : 60 minutes   Oswald Hillock M.D on 02/15/2019 at 3:16 AM

## 2019-02-15 NOTE — Progress Notes (Signed)
Nutrition Brief Note  Patient identified on the Malnutrition Screening Tool (MST) Report. He had reported unintentional weight loss in absence of any appetite/intake changes.   RD operating remotely on weekends. Attempted to reach pt at two separate times of day via phone, but unsuccessful. There is a paucity of chart history to go off of. Will continue Ensure Enlive until more more information is available to guide interventions  No nutrition interventions warranted at this time. If nutrition issues arise, please consult RD.   Burtis Junes RD, LDN, CNSC Clinical Nutrition Available Tues-Sat via Pager: [phone] 02/15/2019 5:38 PM

## 2019-02-16 DIAGNOSIS — I2510 Atherosclerotic heart disease of native coronary artery without angina pectoris: Secondary | ICD-10-CM | POA: Diagnosis present

## 2019-02-16 DIAGNOSIS — D649 Anemia, unspecified: Secondary | ICD-10-CM | POA: Diagnosis present

## 2019-02-16 LAB — BASIC METABOLIC PANEL
Anion gap: 10 (ref 5–15)
BUN: 8 mg/dL (ref 6–20)
CO2: 24 mmol/L (ref 22–32)
Calcium: 8.8 mg/dL — ABNORMAL LOW (ref 8.9–10.3)
Chloride: 105 mmol/L (ref 98–111)
Creatinine, Ser: 0.94 mg/dL (ref 0.61–1.24)
GFR calc Af Amer: >60 mL/min (ref >60)
GFR calc non Af Amer: >60 mL/min (ref >60)
Glucose, Bld: 108 mg/dL — ABNORMAL HIGH (ref 70–99)
Potassium: 3.8 mmol/L (ref 3.5–5.1)
Sodium: 139 mmol/L (ref 135–145)

## 2019-02-16 LAB — CBC
HCT: 26.9 % — ABNORMAL LOW (ref 39.0–52.0)
Hemoglobin: 7.1 g/dL — ABNORMAL LOW (ref 13.0–17.0)
MCH: 17.7 pg — ABNORMAL LOW (ref 26.0–34.0)
MCHC: 26.4 g/dL — ABNORMAL LOW (ref 30.0–36.0)
MCV: 66.9 fL — ABNORMAL LOW (ref 80.0–100.0)
Platelets: 362 10*3/uL (ref 150–400)
RBC: 4.02 MIL/uL — ABNORMAL LOW (ref 4.22–5.81)
RDW: 19 % — ABNORMAL HIGH (ref 11.5–15.5)
WBC: 6.7 10*3/uL (ref 4.0–10.5)
nRBC: 0 % (ref 0.0–0.2)

## 2019-02-16 LAB — HIV ANTIBODY (ROUTINE TESTING W REFLEX): HIV Screen 4th Generation wRfx: NONREACTIVE

## 2019-02-16 NOTE — Progress Notes (Signed)
Subjective: Abdominal pain improved. Frequent stool resolved. Noted small amount of red mucus with wiping. Feels much better. Tolerating full liquids. Would like to advance diet.   Objective: Vital signs in last 24 hours: Temp:  [98.4 F (36.9 C)-98.6 F (37 C)] 98.4 F (36.9 C) (05/31 0602) Pulse Rate:  [61-71] 63 (05/31 0602) Resp:  [17] 17 (05/31 0602) BP: (100-106)/(70-72) 100/70 (05/31 0602) SpO2:  [94 %-98 %] 98 % (05/31 0602) Last BM Date: 02/15/19 General:   Alert and oriented, pleasant Abdomen:  Bowel sounds present, soft, non-tender, non-distended. No HSM or hernias noted. No rebound or guarding. No masses appreciated  Neurologic:  Alert and  oriented X 4  Intake/Output from previous day: 05/30 0701 - 05/31 0700 In: 3195 [P.O.:980; I.V.:1915; IV Piggyback:300] Out: -  Intake/Output this shift: No intake/output data recorded.  Lab Results: Recent Labs    02/14/19 2234 02/16/19 0630  WBC 14.6* 6.7  HGB 7.9* 7.1*  HCT 29.6* 26.9*  PLT 384 362   BMET Recent Labs    02/14/19 2234 02/15/19 0627 02/16/19 0630  NA 137 139 139  K 4.6 3.9 3.8  CL 102 102 105  CO2 23 26 24   GLUCOSE 147* 114* 108*  BUN 21* 18 8  CREATININE 1.15 0.99 0.94  CALCIUM 9.4 9.3 8.8*   LFT Recent Labs    02/14/19 2234 02/15/19 0627  PROT 6.7 6.6  ALBUMIN 3.6 3.6  AST 21 20  ALT 17 16  ALKPHOS 98 105  BILITOT 0.6 0.5     Studies/Results: Ct Abdomen Pelvis W Contrast  Result Date: 02/15/2019 CLINICAL DATA:  Lower abdominal pain and nausea beginning today. EXAM: CT ABDOMEN AND PELVIS WITH CONTRAST TECHNIQUE: Multidetector CT imaging of the abdomen and pelvis was performed using the standard protocol following bolus administration of intravenous contrast. CONTRAST:  156mL OMNIPAQUE IOHEXOL 300 MG/ML  SOLN COMPARISON:  None. FINDINGS: Lower Chest: No acute findings. Hepatobiliary: No hepatic masses identified. Gallbladder is unremarkable. Pancreas:  No mass or inflammatory  changes. Spleen: Within normal limits in size and appearance. Adrenals/Urinary Tract: No masses identified. No evidence of hydronephrosis. Stomach/Bowel: A short-segment area of wall thickening is seen in the mid sigmoid colon with minimal hazy opacity in the adjacent pericolonic fat. This could represent sigmoid diverticulitis or colon carcinoma. No evidence of abscess or bowel obstruction. Vascular/Lymphatic: No pathologically enlarged lymph nodes. No abdominal aortic aneurysm. Aortic atherosclerosis. Reproductive:  No mass or other significant abnormality. Other:  None. Musculoskeletal:  No suspicious bone lesions identified. IMPRESSION: 1. Short-segment wall thickening in mid sigmoid colon, with minimal pericolonic hazy opacity. Differential diagnosis includes sigmoid diverticulitis and colon carcinoma. Consider further evaluation with colonoscopy or short-term follow-up CT in 3-4 weeks. 2. No evidence of abscess or bowel obstruction. Aortic Atherosclerosis (ICD10-I70.0). Electronically Signed   By: Earle Gell M.D.   On: 02/15/2019 01:52   Dg Chest Port 1 View  Result Date: 02/14/2019 CLINICAL DATA:  Shortness of breath and dizziness EXAM: PORTABLE CHEST 1 VIEW COMPARISON:  None. FINDINGS: The heart size and mediastinal contours are within normal limits. Both lungs are clear. The visualized skeletal structures are unremarkable. IMPRESSION: No active disease. Electronically Signed   By: Donavan Foil M.D.   On: 02/14/2019 23:25    Assessment: 58 year old male presenting with abdominal pain, loose stool after bout with constipation, and CT noting short segment wall thickening in mid sigmoid colon and started on IV antibiotics for diverticulitis. Found to have microcytic anemia, with Hgb  7.9 and ferritin 2, consistent with IDA. Hgb 7.1 today. Clinically, he feels much improved s/p supportive measures. Scant amount of blood with wiping likely benign anorectal source.   Will need diagnostic colonoscopy  once acute illness resolved to assess for occult neoplasm. He is asking for a tentative date prior to discharge, as he will be returning to the [place] and will need to show necessity for procedure.   Discussed with hospitalist. IV antibiotics continue today but transition to oral tomorrow. Will increase diet to low-residue.   Plan: Low-residue diet Continue IV antibiotics and transition to oral tomorrow  Hopeful discharge in next 24 hours if continues to improve Will need outpatient diagnostic colonoscopy in next 3-4 weeks Will reassess tomorrow  Annitta Needs, PhD, ANP-BC [facility]    LOS: 1 day    02/16/2019, 11:03 AM

## 2019-02-16 NOTE — Progress Notes (Addendum)
Patient Demographics:    Walter Allen, is a 58 y.o. male, DOB - 01/10/1961, BDZ:[id number]  Admit date - 02/14/2019   Admitting Physician Oswald Hillock, MD  Outpatient Primary MD for the patient is Patient, No Pcp Per  LOS - 1   Chief Complaint  Patient presents with  . Shortness of Breath        Subjective:    Walter Allen today has no fevers, no emesis,  No chest pain, had loose stools with some mucus and pink/blood tinged secretion  Assessment  & Plan :    Principal Problem:   Acute sigmoid diverticulitis--need to rule out malignancy/mass Active Problems:   Acute anemia Vs chronic anemia with superimposed acute blood loss anemia   CAD (coronary artery disease)/Stents in 2016   CT Abd/Pelvis IMPRESSION: 1. Short-segment wall thickening in mid sigmoid colon, with minimal pericolonic hazy opacity. Differential diagnosis includes sigmoid diverticulitis and colon carcinoma. Consider further evaluation with colonoscopy or short-term follow-up CT in 3-4 weeks. 2. No evidence of abscess or bowel obstruction.  Brief Summary 58 y.o. male with a history of CAD status post stent placement in 2016, hyperlipidemia, hypertension admitted on 02/15/2019 from breathing with abdominal pain and diarrhea and found to have sigmoid diverticulitis with possible underlying colorectal mass  A/p 1) acute diverticulitis of the sigmoid colon ---D/w Gi service...  There is a concern about underlying malignancy , okay to treat diverticulitis with antibiotics for now with possible endoluminal evaluation/colonoscopy in 3 to 4 weeks--- .Marland Kitchen Patient has not had colonoscopy previously . continue IV Rocephin and Flagyl,... White count is down to 6.7 from 14.6  2)CAD----asymptomatic, no ACS type symptoms, coronary stents were placed in 2016, okay to stop Plavix but continue aspirin, metoprolol and Imdur  3)Anemia-- Acute on  chronic versus chronic-----suspect some component of superimposed acute blood loss anemia, hypochromic and microcytic anemia, with  ferritin 2, serum iron is 12 with iron saturation of 3 , B12 and folate are not low disease consistent with IDA. Hgb 7.1 today from 7.9 yesterday, monitor H&H and transfuse as clinically indicated  Disposition/Need for in-Hospital Stay- patient unable to be discharged at this time due to acute diverticulitis requiring IV antibiotics, acute blood loss anemia with hemoglobin trending down.... Patient may need transfusion or endoluminal evaluation if anemia worsens  Code Status : Full  Family Communication:   na  Disposition Plan  : [facility] in 1 to 2 days to improve  Consults  :  Gi  DVT Prophylaxis  :   - SCDs /Gi Bleed  Lab Results  Component Value Date   PLT 362 02/16/2019    Inpatient Medications  Scheduled Meds: . aspirin  81 mg Oral Daily  . atorvastatin  80 mg Oral Daily  . feeding supplement (ENSURE ENLIVE)  237 mL Oral BID BM  . isosorbide dinitrate  30 mg Oral QID  . metoprolol succinate  25 mg Oral Daily  . spironolactone  25 mg Oral Daily   Continuous Infusions: . sodium chloride 75 mL/hr (02/16/19 0519)  . cefTRIAXone (ROCEPHIN)  IV 2 g (02/16/19 0008)  . metronidazole 500 mg (02/16/19 1111)   PRN Meds:.acetaminophen, ondansetron **OR** ondansetron (ZOFRAN) IV    Anti-infectives (From admission, onward)  Start     Dose/Rate Route Frequency Ordered Stop   02/16/19 0100  cefTRIAXone (ROCEPHIN) 2 g in sodium chloride 0.9 % 100 mL IVPB     2 g 200 mL/hr over 30 Minutes Intravenous Every 24 hours 02/15/19 0359     02/15/19 1200  metroNIDAZOLE (FLAGYL) IVPB 500 mg     500 mg 100 mL/hr over 60 Minutes Intravenous Every 8 hours 02/15/19 0359     02/15/19 0215  cefTRIAXone (ROCEPHIN) 2 g in sodium chloride 0.9 % 100 mL IVPB     2 g 200 mL/hr over 30 Minutes Intravenous  Once 02/15/19 0209 02/15/19 2139   02/15/19 0215   metroNIDAZOLE (FLAGYL) IVPB 500 mg     500 mg 100 mL/hr over 60 Minutes Intravenous  Once 02/15/19 0209 02/15/19 0403        Objective:   Vitals:   02/15/19 2017 02/15/19 2233 02/16/19 0602 02/16/19 1540  BP:  104/72 100/70 103/67  Pulse:  61 63 68  Resp:  17 17 20   Temp:  98.6 F (37 C) 98.4 F (36.9 C) 98.7 F (37.1 C)  TempSrc:  Oral Oral Oral  SpO2: 98% 97% 98% 98%  Weight:      Height:        Wt Readings from Last 3 Encounters:  02/14/19 93.9 kg     Intake/Output Summary (Last 24 hours) at 02/16/2019 1545 Last data filed at 02/16/2019 3614 Gross per 24 hour  Intake 3195.03 ml  Output -  Net 3195.03 ml     Physical Exam Patient is examined daily including today on 02/16/19 , exams remain the same as of yesterday except that has changed   Gen:- Awake Alert,  In no apparent distress  HEENT:- [place].AT, No sclera icterus Neck-Supple Neck,No JVD,.  Lungs-  CTAB , fair symmetrical air movement CV- S1, S2 normal, regular  Abd-  +ve B.Sounds, Abd Soft, lower quadrant/lower abdominal tenderness slightly improved, no rebound or guarding  extremity/Skin:- No  edema, pedal pulses present  Psych-affect is appropriate, oriented x3 Neuro-no new focal deficits, no tremors   Data Review:   Micro Results Recent Results (from the past 240 hour(s))  SARS Coronavirus 2 (CEPHEID - Performed in [place] hospital lab), Hosp Order     Status: None   Collection Time: 02/15/19  2:34 AM  Result Value Ref Range Status   SARS Coronavirus 2 NEGATIVE NEGATIVE Final    Comment: (NOTE) If result is NEGATIVE SARS-CoV-2 target nucleic acids are NOT DETECTED. The SARS-CoV-2 RNA is generally detectable in upper and lower  respiratory specimens during the acute phase of infection. The lowest  concentration of SARS-CoV-2 viral copies this assay can detect is 250  copies / mL. A negative result does not preclude SARS-CoV-2 infection  and should not be used as the sole basis for treatment or  other  patient management decisions.  A negative result may occur with  improper specimen collection / handling, submission of specimen other  than nasopharyngeal swab, presence of viral mutation(s) within the  areas targeted by this assay, and inadequate number of viral copies  (<250 copies / mL). A negative result must be combined with clinical  observations, patient history, and epidemiological information. If result is POSITIVE SARS-CoV-2 target nucleic acids are DETECTED. The SARS-CoV-2 RNA is generally detectable in upper and lower  respiratory specimens dur ing the acute phase of infection.  Positive  results are indicative of active infection with SARS-CoV-2.  Clinical  correlation  with patient history and other diagnostic information is  necessary to determine patient infection status.  Positive results do  not rule out bacterial infection or co-infection with other viruses. If result is PRESUMPTIVE POSTIVE SARS-CoV-2 nucleic acids MAY BE PRESENT.   A presumptive positive result was obtained on the submitted specimen  and confirmed on repeat testing.  While 2019 novel coronavirus  (SARS-CoV-2) nucleic acids may be present in the submitted sample  additional confirmatory testing may be necessary for epidemiological  and / or clinical management purposes  to differentiate between  SARS-CoV-2 and other Sarbecovirus currently known to infect humans.  If clinically indicated additional testing with an alternate test  methodology [phone]) is advised. The SARS-CoV-2 RNA is generally  detectable in upper and lower respiratory sp ecimens during the acute  phase of infection. The expected result is Negative. Fact Sheet for Patients:  StrictlyIdeas.no Fact Sheet for Healthcare Providers: BankingDealers.co.za This test is not yet approved or cleared by the [place] FDA and has been authorized for detection and/or diagnosis of  SARS-CoV-2 by FDA under an Emergency Use Authorization (EUA).  This EUA will remain in effect (meaning this test can be used) for the duration of the COVID-19 declaration under Section 564(b)(1) of the Act, 21 U.S.C. section 360bbb-3(b)(1), unless the authorization is terminated or revoked sooner. Performed at [facility], [address] [place], [place] [address]   MRSA PCR Screening     Status: None   Collection Time: 02/15/19  4:30 AM  Result Value Ref Range Status   MRSA by PCR NEGATIVE NEGATIVE Final    Comment:        The GeneXpert MRSA Assay (FDA approved for NASAL specimens only), is one component of a comprehensive MRSA colonization surveillance program. It is not intended to diagnose MRSA infection nor to guide or monitor treatment for MRSA infections. Performed at [facility], [address], [place],  [address]     Radiology Reports Ct Abdomen Pelvis W Contrast  Result Date: 02/15/2019 CLINICAL DATA:  Lower abdominal pain and nausea beginning today. EXAM: CT ABDOMEN AND PELVIS WITH CONTRAST TECHNIQUE: Multidetector CT imaging of the abdomen and pelvis was performed using the standard protocol following bolus administration of intravenous contrast. CONTRAST:  136mL OMNIPAQUE IOHEXOL 300 MG/ML  SOLN COMPARISON:  None. FINDINGS: Lower Chest: No acute findings. Hepatobiliary: No hepatic masses identified. Gallbladder is unremarkable. Pancreas:  No mass or inflammatory changes. Spleen: Within normal limits in size and appearance. Adrenals/Urinary Tract: No masses identified. No evidence of hydronephrosis. Stomach/Bowel: A short-segment area of wall thickening is seen in the mid sigmoid colon with minimal hazy opacity in the adjacent pericolonic fat. This could represent sigmoid diverticulitis or colon carcinoma. No evidence of abscess or bowel obstruction. Vascular/Lymphatic: No pathologically enlarged lymph nodes. No abdominal aortic aneurysm. Aortic atherosclerosis.  Reproductive:  No mass or other significant abnormality. Other:  None. Musculoskeletal:  No suspicious bone lesions identified. IMPRESSION: 1. Short-segment wall thickening in mid sigmoid colon, with minimal pericolonic hazy opacity. Differential diagnosis includes sigmoid diverticulitis and colon carcinoma. Consider further evaluation with colonoscopy or short-term follow-up CT in 3-4 weeks. 2. No evidence of abscess or bowel obstruction. Aortic Atherosclerosis (ICD10-I70.0). Electronically Signed   By: Earle Gell M.D.   On: 02/15/2019 01:52   Dg Chest Port 1 View  Result Date: 02/14/2019 CLINICAL DATA:  Shortness of breath and dizziness EXAM: PORTABLE CHEST 1 VIEW COMPARISON:  None. FINDINGS: The heart size and mediastinal contours are within normal limits.  Both lungs are clear. The visualized skeletal structures are unremarkable. IMPRESSION: No active disease. Electronically Signed   By: Donavan Foil M.D.   On: 02/14/2019 23:25     CBC Recent Labs  Lab 02/14/19 2234 02/16/19 0630  WBC 14.6* 6.7  HGB 7.9* 7.1*  HCT 29.6* 26.9*  PLT 384 362  MCV 65.6* 66.9*  MCH 17.5* 17.7*  MCHC 26.7* 26.4*  RDW 19.0* 19.0*    Chemistries  Recent Labs  Lab 02/14/19 2234 02/15/19 0627 02/16/19 0630  NA 137 139 139  K 4.6 3.9 3.8  CL 102 102 105  CO2 23 26 24   GLUCOSE 147* 114* 108*  BUN 21* 18 8  CREATININE 1.15 0.99 0.94  CALCIUM 9.4 9.3 8.8*  AST 21 20  --   ALT 17 16  --   ALKPHOS 98 105  --   BILITOT 0.6 0.5  --    ------------------------------------------------------------------------------------------------------------------ No results for input(s): CHOL, HDL, LDLCALC, TRIG, CHOLHDL, LDLDIRECT in the last 72 hours.  No results found for: HGBA1C ------------------------------------------------------------------------------------------------------------------ No results for input(s): TSH, T4TOTAL, T3FREE, THYROIDAB in the last 72 hours.  Invalid input(s): FREET3  ------------------------------------------------------------------------------------------------------------------ Recent Labs    02/15/19 0627  VITAMINB12 339  FOLATE 13.1  FERRITIN 2*  TIBC 446  IRON 12*  RETICCTPCT 1.7    Coagulation profile No results for input(s): INR, PROTIME in the last 168 hours.  No results for input(s): DDIMER in the last 72 hours.  Cardiac Enzymes Recent Labs  Lab 02/14/19 2234  TROPONINI <0.03   ------------------------------------------------------------------------------------------------------------------ No results found for: BNP   Roxan Hockey M.D on 02/16/2019 at 3:45 PM  Go to [email or web address] - for contact info  [facility] - Office  [phone]

## 2019-02-17 DIAGNOSIS — K5732 Diverticulitis of large intestine without perforation or abscess without bleeding: Secondary | ICD-10-CM

## 2019-02-17 LAB — CBC
HCT: 26.4 % — ABNORMAL LOW (ref 39.0–52.0)
Hemoglobin: 7 g/dL — ABNORMAL LOW (ref 13.0–17.0)
MCH: 17.7 pg — ABNORMAL LOW (ref 26.0–34.0)
MCHC: 26.5 g/dL — ABNORMAL LOW (ref 30.0–36.0)
MCV: 66.7 fL — ABNORMAL LOW (ref 80.0–100.0)
Platelets: 341 10*3/uL (ref 150–400)
RBC: 3.96 MIL/uL — ABNORMAL LOW (ref 4.22–5.81)
RDW: 19.1 % — ABNORMAL HIGH (ref 11.5–15.5)
WBC: 8.1 10*3/uL (ref 4.0–10.5)
nRBC: 0 % (ref 0.0–0.2)

## 2019-02-17 MED ORDER — CEFDINIR 300 MG PO CAPS
300.0000 mg | ORAL_CAPSULE | Freq: Two times a day (BID) | ORAL | Status: DC
  Administered 2019-02-17: 14:00:00 300 mg via ORAL
  Filled 2019-02-17 (×5): qty 1

## 2019-02-17 MED ORDER — METRONIDAZOLE 500 MG PO TABS: 500.0000 mg | ORAL_TABLET | Freq: Three times a day (TID) | ORAL | 0 refills | Status: AC

## 2019-02-17 MED ORDER — CEFDINIR 300 MG PO CAPS: 300.0000 mg | ORAL_CAPSULE | Freq: Two times a day (BID) | ORAL | 0 refills | Status: AC

## 2019-02-17 MED ORDER — ONDANSETRON HCL 4 MG PO TABS: 4.0000 mg | ORAL_TABLET | Freq: Four times a day (QID) | ORAL | 0 refills | Status: DC | PRN

## 2019-02-17 MED ORDER — METRONIDAZOLE 500 MG PO TABS
500.0000 mg | ORAL_TABLET | Freq: Three times a day (TID) | ORAL | Status: DC
  Filled 2019-02-17 (×4): qty 1

## 2019-02-17 MED ORDER — ASPIRIN EC 81 MG PO TBEC: 81.0000 mg | DELAYED_RELEASE_TABLET | Freq: Every day | ORAL | 2 refills | Status: AC

## 2019-02-17 MED ORDER — FERROUS SULFATE 325 (65 FE) MG PO TABS: ORAL_TABLET | ORAL | 3 refills | Status: AC

## 2019-02-17 MED ORDER — OMEPRAZOLE 20 MG PO CPDR: 20.0000 mg | DELAYED_RELEASE_CAPSULE | Freq: Two times a day (BID) | ORAL | 1 refills | Status: AC

## 2019-02-17 NOTE — Progress Notes (Signed)
IV removed, 2x2 gauze and paper tape applied to site, patient tolerated well.  Reviewed AVS with patient who verbalized understanding.  All discharge paper work given to office that was in room with patient.  Patient transported to police car via wheelchair and transported back to the correction facility by the officer.

## 2019-02-17 NOTE — Progress Notes (Signed)
Subjective: Feeling beter today. Pain generally 0-1 unless he is about to have a bowel movement then it increased to 3-4. Last bowel movement yesterday without melena or hematochezia. Denies N/V. No other GI complaints.  Objective: Vital signs in last 24 hours: Temp:  [98.2 F (36.8 C)-98.7 F (37.1 C)] 98.2 F (36.8 C) (06/01 0611) Pulse Rate:  [65-73] 65 (06/01 0611) Resp:  [18-20] 18 (06/01 0611) BP: (103-111)/(67-74) 109/74 (06/01 0611) SpO2:  [97 %-98 %] 98 % (06/01 0611) Last BM Date: 02/16/19 General:   Alert and oriented, pleasant Head:  Normocephalic and atraumatic. Eyes:  No icterus, sclera clear. Conjuctiva pink.  Heart:  S1, S2 present, no murmurs noted.  Lungs: Clear to auscultation bilaterally, without wheezing, rales, or rhonchi.  Abdomen:  Bowel sounds present, soft, non-distended. Mild TTP lower abdomen. No HSM or hernias noted. No rebound or guarding. No masses appreciated  Msk:  Symmetrical without gross deformities. Pulses:  Normal bilateral DP pulses noted. Extremities:  Without clubbing or edema. Neurologic:  Alert and  oriented x4;  grossly normal neurologically. Skin:  Warm and dry, intact without significant lesions.  Psych:  Alert and cooperative. Normal mood and affect.  Intake/Output from previous day: 05/31 0701 - 06/01 0700 In: 2641.7 [P.O.:1240; I.V.:966.5; IV Piggyback:435.2] Out: -  Intake/Output this shift: No intake/output data recorded.  Lab Results: Recent Labs    02/14/19 2234 02/16/19 0630  WBC 14.6* 6.7  HGB 7.9* 7.1*  HCT 29.6* 26.9*  PLT 384 362   BMET Recent Labs    02/14/19 2234 02/15/19 0627 02/16/19 0630  NA 137 139 139  K 4.6 3.9 3.8  CL 102 102 105  CO2 23 26 24   GLUCOSE 147* 114* 108*  BUN 21* 18 8  CREATININE 1.15 0.99 0.94  CALCIUM 9.4 9.3 8.8*   LFT Recent Labs    02/14/19 2234 02/15/19 0627  PROT 6.7 6.6  ALBUMIN 3.6 3.6  AST 21 20  ALT 17 16  ALKPHOS 98 105  BILITOT 0.6 0.5   PT/INR No  results for input(s): LABPROT, INR in the last 72 hours. Hepatitis Panel No results for input(s): HEPBSAG, HCVAB, HEPAIGM, HEPBIGM in the last 72 hours.   Studies/Results: No results found.  Assessment: 58 year old male presenting with abdominal pain, loose stool after bout with constipation, and CT noting short segment wall thickening in mid sigmoid colon and started on IV antibiotics for diverticulitis. Found to have microcytic anemia, with Hgb 7.9 and ferritin 2, consistent with IDA. Hgb 7.1 today. Began feeling progressively better. Scant amount of blood with wiping yesterday likely benign anorectal source.   Will need diagnostic colonoscopy once acute illness resolved to assess for occult neoplasm. He is asking for a tentative date prior to discharge, as he will be returning to the [facility] and will need to show necessity for procedure.   Tentative previous plan: IV antibiotics transition to oral today. Diet increased to low-residue yesterday. Today he's doing well, abdominal pain nearly resolved. No N/V, hematochezia, melena. Tolerating diet well.  Plan: 1. Outpatient follow-up visit scheduled 04/04/2019 at 11:00 am; pending coordination with Prison; alternative: prison physician to see patient for resolution in 4-6 weeks. 2. Tentative date booked for colonoscopy 04/22/19 (8 weeks) at 7:30 am 3. Transition to po antibiotics to complete course 4. Anticipate d/c today   Thank you for allowing [place] to participate in the care of Walter Levy, DNP, AGNP-C Adult & Gerontological Nurse Practitioner [facility]  LOS: 2 days    02/17/2019, 7:49 AM

## 2019-02-17 NOTE — Clinical Social Work Note (Signed)
[place] [employer] was contacted. Spoke with [employer]. Faxed discharge summary to [place] at [phone].     Azari Janssens, Clydene Pugh, LCSW

## 2019-02-17 NOTE — Discharge Instructions (Signed)
1)Stop Plavix, continue baby Aspirin with food 2)Avoid ibuprofen/Advil/Aleve/Motrin/Goody Powders/Naproxen/BC powders/Meloxicam/Diclofenac/Indomethacin and other Nonsteroidal anti-inflammatory medications as these will make you more likely to bleed and can cause stomach ulcers, can also cause Kidney problems.  3)Outpatient follow-up visit with Dr Gala Romney (gastroenterologist at [facility] in [place] , [place]) is scheduled for 04/04/2019 at 11:00 am; pending coordination with Prison; alternative: prison physician to see patient for resolution in 4-6 weeks. 4)Tentative date scheduled for colonoscopy 04/22/19/20 (8 weeks) at 7:30 am 5) take your medications as prescribed 6) please call or return if fevers, increased abdominal pain or rectal bleeding 7) please note that your stools will be dark due to iron pills

## 2019-02-17 NOTE — Discharge Summary (Addendum)
Walter Allen, is a 58 y.o. male  DOB Feb 02, 1961  MRN [id number].  Admission date:  02/14/2019  Admitting Physician  Oswald Hillock, MD  Discharge Date:  02/17/2019   Primary MD  Patient, No Pcp Per  Recommendations for primary care physician for things to follow:   1)Stop Plavix, continue baby Aspirin with food 2)Avoid ibuprofen/Advil/Aleve/Motrin/Goody Powders/Naproxen/BC powders/Meloxicam/Diclofenac/Indomethacin and other Nonsteroidal anti-inflammatory medications as these will make you more likely to bleed and can cause stomach ulcers, can also cause Kidney problems.  3)Outpatient follow-up visit with Dr Gala Romney (gastroenterologist at [facility] in [place] , [place]) is scheduled for 04/04/2019 at 11:00 am; pending coordination with Prison; alternative: prison physician to see patient for resolution in 4-6 weeks. 4)Tentative date scheduled for colonoscopy 04/22/19/20 (8 weeks) at 7:30 am 5) take your medications as prescribed 6) please call or return if fevers, increased abdominal pain or rectal bleeding 7) please note that your stools will be dark due to iron pills  Admission Diagnosis  SOB (shortness of breath) [R06.02] Sigmoid diverticulitis [K57.32] Acute anemia [D64.9]  Discharge Diagnosis  SOB (shortness of breath) [R06.02] Sigmoid diverticulitis [K57.32] Acute anemia [D64.9]    Principal Problem:   Acute sigmoid diverticulitis--need to rule out malignancy/mass Active Problems:   Acute anemia Vs chronic anemia with superimposed acute blood loss anemia   CAD (coronary artery disease)/Stents in 2016   Sigmoid diverticulitis     Past Medical History:  Diagnosis Date   Abscess of mouth    Arthritis    Cellulitis of mouth    Chalazion    Chronic rhinitis    Corns and callosities    Coronary artery disease    Cough    Functional dyspepsia    GERD (gastroesophageal reflux  disease)    Heart disease    severe left ventricular systolic dysfunction after anterior MI   Hemoptysis    Hyperlipidemia    Hypertension    Joint pain    Presbyopia    STEMI (ST elevation myocardial infarction) ([place])    Tinea unguium    Toe pain    Vitamin D deficiency     Past Surgical History:  Procedure Laterality Date   CORONARY STENT PLACEMENT  2016     HPI  from the history and physical done on the day of admission:    Walter Allen  is a 58 y.o. male, with a history of CAD status post stent placement in 2016, hyperlipidemia, hypertension who is currently in prison was brought to the ED with chief complaint of abdominal pain and diarrhea which started around 4 PM yesterday.  Patient says that he was constipated for past few days and today he had multiple small BMs and after that he developed diarrhea.  He had 7-8 loose BMs today.  Denies blood in the stool.  Denies vomiting but only complaint of nausea.  Denies fever or chills. Also had shortness of breath and sweating episode. Denies chest pain. Denies weight loss. No previous history of cancer. Never had colonoscopy in the  past. In the ED CT scan of the abdomen pelvis showed short segment wall thickening in the mid sigmoid colon with minimal pericolonic hazy opacity.  Patient started on ceftriaxone and Flagyl. CBC showed hemoglobin 7.9.    Hospital Course:     CT Abd/PelvisIMPRESSION: 1. Short-segment wall thickening in mid sigmoid colon, with minimal pericolonic hazy opacity. Differential diagnosis includes sigmoid diverticulitis and colon carcinoma. Consider further evaluation with colonoscopy or short-term follow-up CT in 3-4 weeks. 2. No evidence of abscess or bowel obstruction.  Brief Summary 58 y.o.malewith a history of CAD status post stent placement in 2016, hyperlipidemia, hypertension admitted on 02/15/2019 from [facility] with abdominal pain and diarrhea and found to have sigmoid  diverticulitis with possible underlying colorectal mass  A/p 1)Acute Diverticulitis of the Sigmoid colon ---D/w Gi service... There is a concern about underlying malignancy ,  patient was treated for acute diverticulitis with IV Rocephin and IV Flagyl, leukocytosis resolved, abdominal pain resolved.  .. Patient has not had colonoscopy previously .   Will discharge on p.o. Omnicef and Flagyl, Outpatient follow-up visit with Dr Gala Romney (gastroenterologist at [facility] in [place] , [place]) is scheduled for 04/04/2019 at 11:00 am; pending coordination with Prison; alternative: prison physician to see patient for resolution in 4-6 weeks. Tentative date scheduled for colonoscopy 04/22/19/20 (8 weeks) at 7:30 am  2)CAD----asymptomatic, no ACS type symptoms, coronary stents were placed in 2016, okay to stop Plavix , but continue aspirin with food, continue Lipitor, metoprolol and Imdur  3)Iron Deficiency Anemia-- Acute on chronic versus chronic-----suspect some component of superimposed acute blood loss anemia, hypochromic and microcytic anemia, with  ferritin 2, serum iron is 12 with iron saturation of 3 , B12 and folate are not low disease consistent with IDA. Hemoglobin is been around 7 for the last 3 days.  Patient stools are brown.  Okay to discharge home on iron pills--colonoscopic evaluation as above in #1 scheduled   Disposition --- back to correctional facility  Code Status : Full  Family Communication:   na  Disposition Plan  : Back to prison in 1 to 2 days to improve  Consults  :  Gi  Discharge Condition: stable  Follow UP--Outpatient follow-up visit with Dr Gala Romney (gastroenterologist at [facility] in [place] , [place]) is scheduled for 04/04/2019 at 11:00 am; pending coordination with Prison; alternative: prison physician to see patient for resolution in 4-6 weeks. Tentative date scheduled for colonoscopy 04/22/19/20 (8 weeks) at 7:30 am  Diet and Activity recommendation:   As advised  Discharge Instructions    Discharge Instructions    Call MD for:  difficulty breathing, headache or visual disturbances   Complete by:  As directed    Call MD for:  persistant dizziness or light-headedness   Complete by:  As directed    Call MD for:  persistant nausea and vomiting   Complete by:  As directed    Call MD for:  severe uncontrolled pain   Complete by:  As directed    Call MD for:  temperature >100.4   Complete by:  As directed    Diet - low sodium heart healthy   Complete by:  As directed    Discharge instructions   Complete by:  As directed    1)Stop Plavix, continue baby Aspirin with food 2)Avoid ibuprofen/Advil/Aleve/Motrin/Goody Powders/Naproxen/BC powders/Meloxicam/Diclofenac/Indomethacin and other Nonsteroidal anti-inflammatory medications as these will make you more likely to bleed and can cause stomach ulcers, can also cause Kidney problems.  3)Outpatient follow-up  visit with Dr Gala Romney (gastroenterologist at [facility] in [place] , [place]) is scheduled for 04/04/2019 at 11:00 am; pending coordination with Prison; alternative: prison physician to see patient for resolution in 4-6 weeks. 4)Tentative date scheduled for colonoscopy 04/22/19/20 (8 weeks) at 7:30 am 5) take your medications as prescribed 6) please call or return if fevers, increased abdominal pain or rectal bleeding 7) please note that your stools will be dark due to iron pills   Increase activity slowly   Complete by:  As directed        Discharge Medications     Allergies as of 02/17/2019      Reactions   [place] Sink Virosa]       Medication List    STOP taking these medications   aspirin 81 MG chewable tablet Replaced by:  aspirin EC 81 MG tablet   clopidogrel 75 MG tablet Commonly known as:  PLAVIX     TAKE these medications   acetaminophen 325 MG tablet Commonly known as:  TYLENOL Take 650 mg by mouth every 6 (six) hours as needed.   aspirin EC 81 MG  tablet Take 1 tablet (81 mg total) by mouth daily with breakfast. Replaces:  aspirin 81 MG chewable tablet   atorvastatin 80 MG tablet Commonly known as:  LIPITOR Take 80 mg by mouth daily.   cefdinir 300 MG capsule Commonly known as:  OMNICEF Take 1 capsule (300 mg total) by mouth 2 (two) times daily for 7 days.   cetirizine 10 MG tablet Commonly known as:  ZYRTEC Take 10 mg by mouth daily.   ferrous sulfate 325 (65 FE) MG tablet 1 tablet twice a day with meals   furosemide 20 MG tablet Commonly known as:  LASIX Take 20 mg by mouth.   isosorbide dinitrate 30 MG tablet Commonly known as:  ISORDIL Take 30 mg by mouth 4 (four) times daily.   metoprolol succinate 25 MG 24 hr tablet Commonly known as:  TOPROL-XL Take 25 mg by mouth daily.   metroNIDAZOLE 500 MG tablet Commonly known as:  Flagyl Take 1 tablet (500 mg total) by mouth 3 (three) times daily for 7 days.   nitroGLYCERIN 0.4 MG SL tablet Commonly known as:  NITROSTAT Place 0.4 mg under the tongue every 5 (five) minutes as needed for chest pain.   omeprazole 20 MG capsule Commonly known as:  PRILOSEC Take 1 capsule (20 mg total) by mouth 2 (two) times daily before a meal. What changed:  when to take this   ondansetron 4 MG tablet Commonly known as:  ZOFRAN Take 1 tablet (4 mg total) by mouth every 6 (six) hours as needed for nausea or vomiting.   spironolactone 25 MG tablet Commonly known as:  ALDACTONE Take 25 mg by mouth daily.   triamcinolone 0.147 MG/GM topical spray Commonly known as:  KENALOG Apply topically 2 (two) times daily.      Major procedures and Radiology Reports - PLEASE review detailed and final reports for all details, in brief -   Ct Abdomen Pelvis W Contrast  Result Date: 02/15/2019 CLINICAL DATA:  Lower abdominal pain and nausea beginning today. EXAM: CT ABDOMEN AND PELVIS WITH CONTRAST TECHNIQUE: Multidetector CT imaging of the abdomen and pelvis was performed using the standard  protocol following bolus administration of intravenous contrast. CONTRAST:  110mL OMNIPAQUE IOHEXOL 300 MG/ML  SOLN COMPARISON:  None. FINDINGS: Lower Chest: No acute findings. Hepatobiliary: No hepatic masses identified. Gallbladder is unremarkable. Pancreas:  No  mass or inflammatory changes. Spleen: Within normal limits in size and appearance. Adrenals/Urinary Tract: No masses identified. No evidence of hydronephrosis. Stomach/Bowel: A short-segment area of wall thickening is seen in the mid sigmoid colon with minimal hazy opacity in the adjacent pericolonic fat. This could represent sigmoid diverticulitis or colon carcinoma. No evidence of abscess or bowel obstruction. Vascular/Lymphatic: No pathologically enlarged lymph nodes. No abdominal aortic aneurysm. Aortic atherosclerosis. Reproductive:  No mass or other significant abnormality. Other:  None. Musculoskeletal:  No suspicious bone lesions identified. IMPRESSION: 1. Short-segment wall thickening in mid sigmoid colon, with minimal pericolonic hazy opacity. Differential diagnosis includes sigmoid diverticulitis and colon carcinoma. Consider further evaluation with colonoscopy or short-term follow-up CT in 3-4 weeks. 2. No evidence of abscess or bowel obstruction. Aortic Atherosclerosis (ICD10-I70.0). Electronically Signed   By: Earle Gell M.D.   On: 02/15/2019 01:52   Dg Chest Port 1 View  Result Date: 02/14/2019 CLINICAL DATA:  Shortness of breath and dizziness EXAM: PORTABLE CHEST 1 VIEW COMPARISON:  None. FINDINGS: The heart size and mediastinal contours are within normal limits. Both lungs are clear. The visualized skeletal structures are unremarkable. IMPRESSION: No active disease. Electronically Signed   By: Donavan Foil M.D.   On: 02/14/2019 23:25   Micro Results   Recent Results (from the past 240 hour(s))  SARS Coronavirus 2 (CEPHEID - Performed in [place] hospital lab), Hosp Order     Status: None   Collection Time: 02/15/19  2:34 AM    Result Value Ref Range Status   SARS Coronavirus 2 NEGATIVE NEGATIVE Final    Comment: (NOTE) If result is NEGATIVE SARS-CoV-2 target nucleic acids are NOT DETECTED. The SARS-CoV-2 RNA is generally detectable in upper and lower  respiratory specimens during the acute phase of infection. The lowest  concentration of SARS-CoV-2 viral copies this assay can detect is 250  copies / mL. A negative result does not preclude SARS-CoV-2 infection  and should not be used as the sole basis for treatment or other  patient management decisions.  A negative result may occur with  improper specimen collection / handling, submission of specimen other  than nasopharyngeal swab, presence of viral mutation(s) within the  areas targeted by this assay, and inadequate number of viral copies  (<250 copies / mL). A negative result must be combined with clinical  observations, patient history, and epidemiological information. If result is POSITIVE SARS-CoV-2 target nucleic acids are DETECTED. The SARS-CoV-2 RNA is generally detectable in upper and lower  respiratory specimens dur ing the acute phase of infection.  Positive  results are indicative of active infection with SARS-CoV-2.  Clinical  correlation with patient history and other diagnostic information is  necessary to determine patient infection status.  Positive results do  not rule out bacterial infection or co-infection with other viruses. If result is PRESUMPTIVE POSTIVE SARS-CoV-2 nucleic acids MAY BE PRESENT.   A presumptive positive result was obtained on the submitted specimen  and confirmed on repeat testing.  While 2019 novel coronavirus  (SARS-CoV-2) nucleic acids may be present in the submitted sample  additional confirmatory testing may be necessary for epidemiological  and / or clinical management purposes  to differentiate between  SARS-CoV-2 and other Sarbecovirus currently known to infect humans.  If clinically indicated additional  testing with an alternate test  methodology [phone]) is advised. The SARS-CoV-2 RNA is generally  detectable in upper and lower respiratory sp ecimens during the acute  phase of infection. The expected result is  Negative. Fact Sheet for Patients:  StrictlyIdeas.no Fact Sheet for Healthcare Providers: BankingDealers.co.za This test is not yet approved or cleared by the [place] FDA and has been authorized for detection and/or diagnosis of SARS-CoV-2 by FDA under an Emergency Use Authorization (EUA).  This EUA will remain in effect (meaning this test can be used) for the duration of the COVID-19 declaration under Section 564(b)(1) of the Act, 21 U.S.C. section 360bbb-3(b)(1), unless the authorization is terminated or revoked sooner. Performed at [facility], [address], [place], [place] [address]   MRSA PCR Screening     Status: None   Collection Time: 02/15/19  4:30 AM  Result Value Ref Range Status   MRSA by PCR NEGATIVE NEGATIVE Final    Comment:        The GeneXpert MRSA Assay (FDA approved for NASAL specimens only), is one component of a comprehensive MRSA colonization surveillance program. It is not intended to diagnose MRSA infection nor to guide or monitor treatment for MRSA infections. Performed at [facility], [address] [place], [place] [address]    Today   Subjective    Walter Allen today has no new complaints..... Had brown stool, abdominal pain is mostly resolved, tolerating oral intake well, no fevers no chills          Patient has been seen and examined prior to discharge   Objective   Blood pressure 109/74, pulse 65, temperature 98.2 F (36.8 C), temperature source Oral, resp. rate 18, height 5\' 9"  (1.753 m), weight 93.9 kg, SpO2 98 %.   Intake/Output Summary (Last 24 hours) at 02/17/2019 1126 Last data filed at 02/17/2019 0600 Gross per 24 hour  Intake 2641.68 ml  Output --  Net  2641.68 ml   Exam Gen:- Awake Alert,  In no apparent distress  HEENT:- [place].AT, No sclera icterus Neck-Supple Neck,No JVD,.  Lungs-  CTAB , fair symmetrical air movement CV- S1, S2 normal, regular  Abd-  +ve B.Sounds, Abd Soft, lower quadrant/lower abdominal tenderness has improved,  no rebound or guarding  extremity/Skin:- No  edema, pedal pulses present  Psych-affect is appropriate, oriented x3 Neuro-no new focal deficits, no tremors   Data Review   CBC w Diff:  Lab Results  Component Value Date   WBC 8.1 02/17/2019   HGB 7.0 (L) 02/17/2019   HCT 26.4 (L) 02/17/2019   PLT 341 02/17/2019   CMP:  Lab Results  Component Value Date   NA 139 02/16/2019   K 3.8 02/16/2019   CL 105 02/16/2019   CO2 24 02/16/2019   BUN 8 02/16/2019   CREATININE 0.94 02/16/2019   PROT 6.6 02/15/2019   ALBUMIN 3.6 02/15/2019   BILITOT 0.5 02/15/2019   ALKPHOS 105 02/15/2019   AST 20 02/15/2019   ALT 16 02/15/2019  . Outpatient follow-up visit with Dr Gala Romney (gastroenterologist at [facility] in [place] , [place]) is scheduled for 04/04/2019 at 11:00 am; pending coordination with Prison; alternative: prison physician to see patient for resolution in 4-6 weeks. Tentative date scheduled for colonoscopy 04/22/19/20 (8 weeks) at 7:30 am   Total Discharge time is about 33 minutes  Roxan Hockey M.D on 02/17/2019 at 11:26 AM  Go to [email or web address] -  for contact info  [facility] - Office  [phone]

## 2019-02-18 LAB — HEPATITIS C ANTIBODY: HCV Ab: <0.1 s/co ratio (ref 0.0–0.9)

## 2019-04-04 ENCOUNTER — Ambulatory Visit (INDEPENDENT_AMBULATORY_CARE_PROVIDER_SITE_OTHER): Admitting: Nurse Practitioner

## 2019-04-04 ENCOUNTER — Other Ambulatory Visit: Payer: Self-pay

## 2019-04-04 ENCOUNTER — Encounter: Payer: Self-pay | Admitting: Nurse Practitioner

## 2019-04-04 VITALS — BP 109/69 | HR 56 | Temp 98.6°F | Ht 69.0 in | Wt 210.0 lb

## 2019-04-04 DIAGNOSIS — D649 Anemia, unspecified: Secondary | ICD-10-CM

## 2019-04-04 DIAGNOSIS — K5732 Diverticulitis of large intestine without perforation or abscess without bleeding: Secondary | ICD-10-CM | POA: Diagnosis not present

## 2019-04-04 NOTE — Assessment & Plan Note (Signed)
Recent admission for acute sigmoid diverticulitis treated with IV antibiotics and resolved while inpatient.  CT was concerning for possible underlying colorectal mass.  He presents today for follow-up on hospitalization and to schedule colonoscopy.  He does still have some intermittent, brief lower abdominal discomfort typically when he is having a bowel movement that resolves after his bowel movement.  He does admit to several pounds of weight loss of about 7 to 10 pounds over the past few months.  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  His stools are dark/black but he is on iron.  At this point we will proceed with colonoscopy as already scheduled.  I will also check to update his CBC, iron studies because he is not been checked since his discharge.  Follow-up on anemia with the facility.  Proceed with TCS with Dr. Gala Romney in near future: the risks, benefits, and alternatives have been discussed with the patient in detail. The patient states understanding and desires to proceed.  The patient is not on any anticoagulants, anxiolytics, chronic pain medications, or antidepressants.  Conscious sedation should be adequate for his procedure.

## 2019-04-04 NOTE — Progress Notes (Signed)
Referring Provider: No ref. provider found Primary Care Physician:  Patient, No Pcp Per Primary GI:  Dr. Gala Romney  Chief Complaint  Patient presents with  . Diverticulitis    Abd pain,Black stools,F/U from hospital 02/14/2019:Anemia    HPI:   Walter Allen is a 58 y.o. male who presents for hospital follow-up for diverticulitis.  The patient was seen in the hospital from 02/14/2019 through 02/17/2019.  The patient presented with abdominal pain and diarrhea and was found to have sigmoid diverticulitis with possible underlying colorectal mass.  Patient was treated for acute diverticulitis with IV Rocephin and IV Flagyl, leukocytosis resolved as did abdominal pain.  No previous colonoscopy.  Was discharged on oral antibiotics and recommended outpatient follow-up with [facility] pending coordination with the prison and tentative date scheduled for colonoscopy 04/22/2019.  Noted anemia during his hospitalization with an admission hemoglobin of 7.9 which decreased to 7.0 by day of discharge.  Today he states he's doing better than when he was in the hospital. He is accompanied by [employer] as he is incarcerated at [facility]. Intermittent lower abdominal pain, much better than when in the hospital. Pain described as sharp, intermittent every 1-3 days and typically only lasts a few minutes; only occurs when he's using the bathroom to have a bowel movement. Some increasing frequency in the past 3 days. Pain resolves after a bowel movement. Stools are black but he is in iron. 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 States he's lost several pounds unintentionally (about 7-10 lbs over the past 2 months). Denies URI or flu-like symptoms. Denies loss of sense of taste or smell. Denies chest pain, dyspnea, dizziness, lightheadedness, syncope, near syncope. Denies any other upper or lower GI symptoms.  Past Medical History:   Diagnosis Date  . Abscess of mouth   . Arthritis   . Cellulitis of mouth   . Chalazion   . Chronic rhinitis   . Corns and callosities   . Coronary artery disease   . Cough   . Functional dyspepsia   . GERD (gastroesophageal reflux disease)   . Heart disease    severe left ventricular systolic dysfunction after anterior MI  . Hemoptysis   . Hyperlipidemia   . Hypertension   . Joint pain   . Presbyopia   . STEMI (ST elevation myocardial infarction) ([place])   . Tinea unguium   . Toe pain   . Vitamin D deficiency     Past Surgical History:  Procedure Laterality Date  . CORONARY STENT PLACEMENT  2016    Current Outpatient Medications  Medication Sig Dispense Refill  . acetaminophen (TYLENOL) 325 MG tablet Take 650 mg by mouth every 6 (six) hours as needed.    Marland Kitchen aspirin EC 81 MG tablet Take 1 tablet (81 mg total) by mouth daily with breakfast. 30 tablet 2  . atorvastatin (LIPITOR) 80 MG tablet Take 80 mg by mouth daily.    . ferrous sulfate 325 (65 FE) MG tablet 1 tablet twice a day with meals 60 tablet 3  . furosemide (LASIX) 20 MG tablet Take 20 mg by mouth.    . isosorbide dinitrate (ISORDIL) 30 MG tablet Take 30 mg by mouth daily.     . metoprolol succinate (TOPROL-XL) 25 MG 24 hr tablet Take 25 mg by mouth daily.    . nitroGLYCERIN (NITROSTAT) 0.4 MG SL tablet Place 0.4 mg under the tongue every 5 (five) minutes as  needed for chest pain.    Marland Kitchen omeprazole (PRILOSEC) 20 MG capsule Take 1 capsule (20 mg total) by mouth 2 (two) times daily before a meal. 60 capsule 1  . spironolactone (ALDACTONE) 25 MG tablet Take 25 mg by mouth daily.    Marland Kitchen triamcinolone (KENALOG) 0.147 MG/GM topical spray Apply topically 2 (two) times daily.     No current facility-administered medications for this visit.     Allergies as of 04/04/2019 - Review Complete 04/04/2019  Allergen Reaction Noted  . Collard greens [lactuca virosa]  02/14/2019    Family History  Problem Relation Age of Onset  .  Colon cancer Neg Hx   . Colon polyps Neg Hx     Social History   Socioeconomic History  . Marital status: Single    Spouse name: Not on file  . Number of children: Not on file  . Years of education: Not on file  . Highest education level: Some college, no degree  Occupational History  . Not on file  Social Needs  . Financial resource strain: Not on file  . Food insecurity    Worry: Not on file    Inability: Not on file  . Transportation needs    Medical: Not on file    Non-medical: Not on file  Tobacco Use  . Smoking status: Former Smoker    Types: Cigarettes    Quit date: 01/22/2006    Years since quitting: 13.2  . Smokeless tobacco: Never Used  Substance and Sexual Activity  . Alcohol use: Never    Frequency: Never  . Drug use: Never  . Sexual activity: Never  Lifestyle  . Physical activity    Days per week: Not on file    Minutes per session: Not on file  . Stress: Not on file  Relationships  . Social Herbalist on phone: Not on file    Gets together: Not on file    Attends religious service: Not on file    Active member of club or organization: Not on file    Attends meetings of clubs or organizations: Not on file    Relationship status: Not on file  Other Topics Concern  . Not on file  Social History Narrative  . Not on file    Review of Systems: General: Negative for anorexia, weight loss, fever, chills, fatigue, weakness. ENT: Negative for hoarseness, difficulty swallowing. CV: Negative for chest pain, angina, palpitations, peripheral edema.  Respiratory: Negative for dyspnea at rest, cough, sputum, wheezing.  GI: See history of present illness. Endo: Negative for unusual weight change.  Heme: Negative for bruising or bleeding. Allergy: Negative for rash or hives.   Physical Exam: BP 109/69   Pulse (!) 56   Temp 98.6 F (37 C)   Ht 5\' 9"  (1.753 m)   Wt 210 lb (95.3 kg)   BMI 31.01 kg/m  General:   Alert and oriented. Pleasant and  cooperative. Well-nourished and well-developed.  Eyes:  Without icterus, sclera clear and conjunctiva pink.  Ears:  Normal auditory acuity. Cardiovascular:  S1, S2 present without murmurs appreciated. Extremities without clubbing or edema. Respiratory:  Clear to auscultation bilaterally. No wheezes, rales, or rhonchi. No distress.  Gastrointestinal:  +BS, soft, non-tender and non-distended. No HSM noted. No guarding or rebound. No masses appreciated.  Rectal:  Deferred  Musculoskalatal:  Symmetrical without gross deformities. Neurologic:  Alert and oriented x4;  grossly normal neurologically. Psych:  Alert and cooperative. Normal mood and  affect. Heme/Lymph/Immune: No excessive bruising noted.    04/04/2019 11:37 AM   Disclaimer: This note was dictated with voice recognition software. Similar sounding words can inadvertently be transcribed and may not be corrected upon review.

## 2019-04-04 NOTE — Patient Instructions (Signed)
Your health issues we discussed today were:   Previous diverticulitis: 1. We had scheduled a colonoscopy for you.  We are still waiting on a definite answer for the time 2. Let us know if you have any worsening or severe symptoms  Anemia: 1. Continue taking iron. 2. You will need to stop iron 7 days prior to your colonoscopy 3. I am putting in an order to recheck your labs.  You can have this done at a community lab or at the facility, which ever is more consistent with protocol 4. Continue to follow-up with the facility medical staff for management of your anemia 5. Notify us of any significant bleeding in your bowel movements  Overall I recommend:  1. Continue your other current medications 2. Return for follow-up in 4 months or based on recommendations made after your procedure 3. Call us if you have any questions or concerns.   Because of recent events of COVID-19 ("Coronavirus"), follow CDC recommendations:  1. Wash your hand frequently 2. Avoid touching your face 3. Stay away from people who are sick 4. If you have symptoms such as fever, cough, shortness of breath then call your healthcare provider for further guidance 5. If you are sick, STAY AT HOME unless otherwise directed by your healthcare provider. 6. Follow directions from state and national officials regarding staying safe   At [facility] we value your feedback. You may receive a survey about your visit today. Please share your experience as we strive to create trusting relationships with our patients to provide genuine, compassionate, quality care.  We appreciate your understanding and patience as we review any laboratory studies, imaging, and other diagnostic tests that are ordered as we care for you. Our office policy is 5 business days for review of these results, and any emergent or urgent results are addressed in a timely manner for your best interest. If you do not hear from our office in 1 week,  please contact us.   We also encourage the use of MyChart, which contains your medical information for your review as well. If you are not enrolled in this feature, an access code is on this after visit summary for your convenience. Thank you for allowing [place] to be involved in your care.  It was great to see you today!  I hope you have a great summer!!

## 2019-04-04 NOTE — Assessment & Plan Note (Signed)
Acute anemia versus chronic anemia with superimposed acute blood loss during episode of diverticulitis that he was admitted for.  His hemoglobin declined to 7.0 during his hospitalization.  I do not see where his labs have been rechecked.  The patient states he has not had blood work since getting out of the hospital.  At this point proceed with CBC, iron, ferritin, TIBC to evaluate progression of anemia now that he is on iron.  Follow-up with facility medical staff for anemia.  Colonoscopy as per above.

## 2019-04-07 ENCOUNTER — Encounter: Payer: Self-pay | Admitting: Nurse Practitioner

## 2019-04-07 ENCOUNTER — Telehealth: Payer: Self-pay

## 2019-04-07 ENCOUNTER — Other Ambulatory Visit: Payer: Self-pay

## 2019-04-07 DIAGNOSIS — D649 Anemia, unspecified: Secondary | ICD-10-CM

## 2019-04-07 DIAGNOSIS — K5732 Diverticulitis of large intestine without perforation or abscess without bleeding: Secondary | ICD-10-CM

## 2019-04-07 NOTE — Progress Notes (Signed)
cc'ed to [facility]

## 2019-04-07 NOTE — Telephone Encounter (Signed)
TCS w/RMR was scheduled for 04/22/19 at 1:15pm during OV. Guard gave Walter Allen as contact at facility [phone]. Endo scheduler advised pt would need to arrive at 10:15 for rapid COVID test.   Called Walter Allen she states the authorization was approved for TCS at their facility. She is going to resubmit authorization for TCS to be done at [facility]. She is unsure if it will be back in time for TCS to be done 04/22/19. She will let office know.

## 2019-04-11 NOTE — Telephone Encounter (Signed)
Please call [name] at [phone] regarding the patient's procedure.

## 2019-04-14 NOTE — Telephone Encounter (Signed)
Called Walter Allen, TCS approved to be done at [facility]. She requested TCS to be done at a later date as she will be off next week. TCS rescheduled to 06/04/19 at 1:15pm, arrive at 10:15am for rapid COVID test. Endo scheduler informed. Rx for prep and procedure instructions faxed to Walter Allen (fax [phone]).

## 2019-04-16 ENCOUNTER — Telehealth: Payer: Self-pay | Admitting: Internal Medicine

## 2019-04-16 NOTE — Telephone Encounter (Signed)
Spoke to Ms. Dorey, ok to use [employer].

## 2019-04-16 NOTE — Telephone Encounter (Signed)
Pt is at [place] and Mrs Randie Heinz called asking if we could change the prep from [place] to [place] since [place] is on their formulary. Please advise and ask for Mrs Randie Heinz or Mrs Jannifer Franklin. [phone]

## 2019-04-22 ENCOUNTER — Other Ambulatory Visit (HOSPITAL_COMMUNITY)

## 2019-05-29 ENCOUNTER — Telehealth: Payer: Self-pay | Admitting: Internal Medicine

## 2019-05-29 NOTE — Telephone Encounter (Signed)
[place] and spoke with Ms. Hurdle in Medical. They are unable to take patient to procedure for atleast 3 weeks. Patient has been rescheduled to next available 11/3 at 1:00pm. Aware I will fax new prep instructions to them at [phone]. Called endo and made aware of change. Patient will need to arrive 3 hours prior to test for COVID-19. Patient will have rapid done when he arrives.

## 2019-05-29 NOTE — Telephone Encounter (Signed)
[place] called to reschedule patient's procedure on 9/16 with RMR Please call [phone]

## 2019-07-16 ENCOUNTER — Telehealth: Payer: Self-pay

## 2019-07-16 NOTE — Telephone Encounter (Signed)
Ms. Walter Allen from prison called office to cancel TCS scheduled for 07/22/19. They're not allowing inmates to go outside facility d/t COVID. She will call office when he's allowed to leave facility. LMOVM for endo scheduler to cancel procedure.  FYI to EG.

## 2019-07-17 NOTE — Telephone Encounter (Signed)
Noted  

## 2019-07-21 ENCOUNTER — Telehealth: Payer: Self-pay

## 2019-07-21 NOTE — Telephone Encounter (Signed)
Noted  

## 2019-07-21 NOTE — Telephone Encounter (Addendum)
Walter Allen, he already has an upcoming OV 08/19/19.

## 2019-07-21 NOTE — Telephone Encounter (Signed)
Ms. Walter Allen from prison called office and LMOVM asking to reschedule TCS.  Walter Allen, does he need another OV to reschedule TCS w/RMR d/t last OV was 04/04/19 and he has been cancelled x 3? RMR currently booked out until January.

## 2019-07-22 ENCOUNTER — Ambulatory Visit (HOSPITAL_COMMUNITY): Admission: RE | Admit: 2019-07-22 | Source: Home / Self Care | Admitting: Internal Medicine

## 2019-07-22 ENCOUNTER — Encounter (HOSPITAL_COMMUNITY): Admission: RE | Payer: Self-pay | Source: Home / Self Care

## 2019-07-22 SURGERY — COLONOSCOPY
Anesthesia: Moderate Sedation

## 2019-07-23 NOTE — Telephone Encounter (Signed)
Called Ms. Hurtle, informed her pt has upcoming OV 08/19/19 and TCS can be rescheduled then if needed. She needs a letter stating pt needs consult so she can get visit approved. Fax# [phone].

## 2019-07-23 NOTE — Telephone Encounter (Signed)
Letter faxed to Ms. Hurtle.

## 2019-08-19 ENCOUNTER — Other Ambulatory Visit: Payer: Self-pay

## 2019-08-19 ENCOUNTER — Telehealth: Payer: Self-pay | Admitting: *Deleted

## 2019-08-19 ENCOUNTER — Encounter: Payer: Self-pay | Admitting: Nurse Practitioner

## 2019-08-19 ENCOUNTER — Ambulatory Visit (INDEPENDENT_AMBULATORY_CARE_PROVIDER_SITE_OTHER): Admitting: Nurse Practitioner

## 2019-08-19 VITALS — BP 122/76 | HR 72 | Temp 96.8°F | Ht 69.5 in | Wt 216.4 lb

## 2019-08-19 DIAGNOSIS — D649 Anemia, unspecified: Secondary | ICD-10-CM | POA: Diagnosis not present

## 2019-08-19 DIAGNOSIS — K5792 Diverticulitis of intestine, part unspecified, without perforation or abscess without bleeding: Secondary | ICD-10-CM

## 2019-08-19 DIAGNOSIS — R195 Other fecal abnormalities: Secondary | ICD-10-CM | POA: Diagnosis not present

## 2019-08-19 NOTE — Progress Notes (Signed)
Referring Provider: No ref. provider found Primary Care Physician:  Patient, No Pcp Per Primary GI:  Dr. Gala Romney  Chief Complaint  Patient presents with  . Abdominal Pain    mid lower abd    HPI:   Walter Allen is a 58 y.o. male who presents for abdominal pain and diverticulitis.  The patient was last seen in our office 04/04/2019 for sigmoid diverticulitis, acute anemia versus chronic anemia with superimposed acute blood loss anemia.  The patient's last visit was a hospital follow-up for diverticulitis with admission from 02/14/2019 through 02/17/2019.  Found to have sigmoid diverticulitis with possible underlying colorectal mass which was treated with antibiotics and leukocytosis resolved as did abdominal pain.  No previous colonoscopy.  Recommended outpatient follow-up with tentative colonoscopy scheduled for 04/22/2019.  Noted anemia during hospitalization with hemoglobin of 7.9 it was decreased to 7.0 by day of discharge.  At his last visit noted to be doing better than when in the hospital, accompanied by a prison guard as he is currently incarcerated at a correctional facility.  Intermittent lower abdominal pain that is much better than during hospitalization, occurs every 1 to 3 days, typically only lasts a few minutes and usually when he is using the bathroom.  Pain resolves after a bowel movement.  Stools are black but he is on iron and cannot tell if there is any hematochezia because he is partially colorblind.  Has lost several pounds unintentionally, about 7 to 10 pounds over the past 2 months.  No other overt GI complaints.  Recommended CBC, iron, ferritin studies.  Colonoscopy is scheduled.  Follow-up with facility medical staff for anemia treatment.  Continue taking iron.  Follow-up in 4 months.  It does not appear the labs have been completed in our system.  However, they may have been completed at the correctional facility.  We were working with the correctional facility to  schedule colonoscopy.  This was initially rescheduled for 06/04/2019.  Rescheduled for 07/22/2019 due to facility inability to get patient to the hospital for his procedure.  On 07/16/2019 this was finally canceled as the facility is not allowing patients to leave due to Covid.  Facilities notify us when inmates are able to leave.  We were finally contacted on 07/21/2019, 1 day prior to his initial procedure that was canceled, indicating needing to schedule procedure.  It was recommended he complete his office visit on 08/19/2019 and rescheduling could occur at that time.  Today he is accompanied by a correctional facility guard. Today he states he is still having mid- to lower abdominal pain. His weight loss has stabilized (up 6 lbs in the past 4 months objectively). Has some intermittent, ongoing abdominal pain at which point the pain improves. Pain is mid-lower abdominal pain, described as a "sharp cramp." Stools "changing all over the place." Has variable stools ranging from [facility] 1-7, with flatulence. Out of 10 stools he would expect 3 constipated, 4 loose, 3 normal. Abdominal pain sometimes improves with urinating ("it takes the pressure off.") Cant tell if any hematochezia because "everything is just black" (on iron, is colorblind). He is on a stool softener twice daily. Denies N/V, fever, chills, unintentional weight loss. Denies URI or flu-like symptoms. Denies loss of sense of taste or smell. Denies chest pain, dyspnea, dizziness, lightheadedness, syncope, near syncope. Denies any other upper or lower GI symptoms.  Iron ran out 08/16/2019. Hasn't had labs checked recently.   Past Medical History:  Diagnosis Date  . Abscess  of mouth   . Arthritis   . Cellulitis of mouth   . Chalazion   . Chronic rhinitis   . Corns and callosities   . Coronary artery disease   . Cough   . Functional dyspepsia   . GERD (gastroesophageal reflux disease)   . Heart disease    severe left ventricular systolic  dysfunction after anterior MI  . Hemoptysis   . Hyperlipidemia   . Hypertension   . Joint pain   . Presbyopia   . STEMI (ST elevation myocardial infarction) ([place])   . Tinea unguium   . Toe pain   . Vitamin D deficiency     Past Surgical History:  Procedure Laterality Date  . CORONARY STENT PLACEMENT  2016    Current Outpatient Medications  Medication Sig Dispense Refill  . acetaminophen (TYLENOL) 650 MG CR tablet Take 650 mg by mouth 3 (three) times daily.    Marland Kitchen aspirin EC 81 MG tablet Take 1 tablet (81 mg total) by mouth daily with breakfast. 30 tablet 2  . atorvastatin (LIPITOR) 80 MG tablet Take 80 mg by mouth daily.    Marland Kitchen docusate sodium (COLACE) 100 MG capsule Take 100 mg by mouth 2 (two) times daily.    . ferrous sulfate 325 (65 FE) MG tablet 1 tablet twice a day with meals 60 tablet 3  . furosemide (LASIX) 20 MG tablet Take 20 mg by mouth daily.     . isosorbide mononitrate (IMDUR) 30 MG 24 hr tablet Take 30 mg by mouth daily.    . metoprolol succinate (TOPROL-XL) 25 MG 24 hr tablet Take 25 mg by mouth daily.    . nitroGLYCERIN (NITROSTAT) 0.4 MG SL tablet Place 0.4 mg under the tongue every 5 (five) minutes as needed for chest pain.    Marland Kitchen omeprazole (PRILOSEC) 20 MG capsule Take 1 capsule (20 mg total) by mouth 2 (two) times daily before a meal. 60 capsule 1  . spironolactone (ALDACTONE) 25 MG tablet Take 25 mg by mouth daily.     No current facility-administered medications for this visit.     Allergies as of 08/19/2019 - Review Complete 08/19/2019  Allergen Reaction Noted  . Collard greens [lactuca virosa]  02/14/2019    Family History  Problem Relation Age of Onset  . Colon cancer Neg Hx   . Colon polyps Neg Hx     Social History   Socioeconomic History  . Marital status: Single    Spouse name: Not on file  . Number of children: Not on file  . Years of education: Not on file  . Highest education level: Some college, no degree  Occupational History  . Not  on file  Social Needs  . Financial resource strain: Not on file  . Food insecurity    Worry: Not on file    Inability: Not on file  . Transportation needs    Medical: Not on file    Non-medical: Not on file  Tobacco Use  . Smoking status: Former Smoker    Types: Cigarettes    Quit date: 01/22/2006    Years since quitting: 13.5  . Smokeless tobacco: Never Used  Substance and Sexual Activity  . Alcohol use: Never    Frequency: Never  . Drug use: Never  . Sexual activity: Never  Lifestyle  . Physical activity    Days per week: Not on file    Minutes per session: Not on file  . Stress: Not on file  Relationships  . Social Herbalist on phone: Not on file    Gets together: Not on file    Attends religious service: Not on file    Active member of club or organization: Not on file    Attends meetings of clubs or organizations: Not on file    Relationship status: Not on file  Other Topics Concern  . Not on file  Social History Narrative  . Not on file    Review of Systems: General: Negative for anorexia, weight loss, fever, chills, fatigue, weakness. ENT: Negative for hoarseness, difficulty swallowing. CV: Negative for chest pain, angina, palpitations, peripheral edema.  Respiratory: Negative for dyspnea at rest, cough, sputum, wheezing.  GI: See history of present illness. Endo: Negative for unusual weight change.  Heme: Negative for bruising or bleeding. Allergy: Negative for rash or hives.   Physical Exam: BP 122/76   Pulse 72   Temp (!) 96.8 F (36 C) (Temporal)   Ht 5' 9.5" (1.765 m)   Wt 216 lb 6.4 oz (98.2 kg)   BMI 31.50 kg/m  General:   Alert and oriented. Pleasant and cooperative. Well-nourished and well-developed.  Eyes:  Without icterus, sclera clear and conjunctiva pink.  Ears:  Normal auditory acuity. Cardiovascular:  S1, S2 present without murmurs appreciated. Extremities without clubbing or edema. Respiratory:  Clear to auscultation  bilaterally. No wheezes, rales, or rhonchi. No distress.  Gastrointestinal:  +BS, soft, non-tender and non-distended. No HSM noted. No guarding or rebound. No masses appreciated.  Rectal:  Deferred  Musculoskalatal:  Symmetrical without gross deformities. Skin:  Intact without significant lesions or rashes. Neurologic:  Alert and oriented x4;  grossly normal neurologically. Psych:  Alert and cooperative. Normal mood and affect. Heme/Lymph/Immune: No excessive bruising noted.    08/19/2019 10:40 AM   Disclaimer: This note was dictated with voice recognition software. Similar sounding words can inadvertently be transcribed and may not be corrected upon review.

## 2019-08-19 NOTE — Telephone Encounter (Signed)
Guard at [facility] advised I needed to call Walter Allen at [phone] to schedule procedure. Called Walter Allen and she states that before anything can be schedule she has to get authorization 1st. Once they receive auth then we can schedule. She advised this can take 1-2 weeks. I advised the TCS was urgent but states she has no control over the auth getting approved by state. She is going to call once [name] is received. FYI to EG.

## 2019-08-19 NOTE — Assessment & Plan Note (Signed)
The patient is having diarrheal stools with abdominal discomfort that typically improves after a bowel movement.  Varies from constipation or diarrhea.  Breakdown a number of stools of different consistencies as per HPI.  Likely irritable bowel syndrome.  He is currently taking stool softener 1-2 times a day.  At this point recommend continue stool softener for constipation, if he has diarrhea then stop stool softener and start Imodium.  You will need to treat symptomatically.  Follow-up in 4 months.

## 2019-08-19 NOTE — Assessment & Plan Note (Signed)
No significant abdominal pain like he did when he was hospitalized.  CT scan during hospitalization showed large intestinal wall thickening cannot rule out underlying carcinogenic process.  Recommended colonoscopy.  We will try to schedule this for sometime but has been frequently rescheduled and eventually canceled due to COVID-19 and correctional facility policies.  At this point we will schedule a colonoscopy again.  We will ask her to be done somewhat urgently due to it is been 6 months since previous recommendation.  Further recommendations to follow.  Follow-up in 4 months.

## 2019-08-19 NOTE — Patient Instructions (Signed)
Your health issues we discussed today were:   Anemia: 1. Hold iron for now 2. Have your labs drawn as soon as you can, it would likely be done at that correctional facility. 3. Please have them fax the results to [place]. 4. Depending on your iron levels you may need to restart iron 5. Further recommendations to follow-up  Recent diverticulitis: 1. We will schedule your colonoscopy as soon as possible 2. Further recommendations to follow your colonoscopy once we can evaluate the inside of your colon  Constipation alternating with diarrhea: 1. Continue taking stool softeners when you are constipated 2. If you switch to diarrhea, stop stool softeners and take Imodium for diarrhea. 3. When you switch back to constipation stop Imodium and restart stool softeners. 4. Call us if you have any questions about this  Overall I recommend:  1. Continue your other current medications 2. Return for follow-up in 4 months 3. Call us if you have any questions or concerns.   Because of recent events of COVID-19 ("Coronavirus"), follow CDC recommendations:  1. Wash your hand frequently 2. Avoid touching your face 3. Stay away from people who are sick 4. If you have symptoms such as fever, cough, shortness of breath then call your healthcare provider for further guidance 5. If you are sick, STAY AT HOME unless otherwise directed by your healthcare provider. 6. Follow directions from state and national officials regarding staying safe   At [facility] we value your feedback. You may receive a survey about your visit today. Please share your experience as we strive to create trusting relationships with our patients to provide genuine, compassionate, quality care.  We appreciate your understanding and patience as we review any laboratory studies, imaging, and other diagnostic tests that are ordered as we care for you. Our office policy is 5 business days for review of these results, and any  emergent or urgent results are addressed in a timely manner for your best interest. If you do not hear from our office in 1 week, please contact us.   We also encourage the use of MyChart, which contains your medical information for your review as well. If you are not enrolled in this feature, an access code is on this after visit summary for your convenience. Thank you for allowing [place] to be involved in your care.  It was great to see you today!  I hope you have a Merry Christmas!!

## 2019-08-19 NOTE — Telephone Encounter (Signed)
Noted, thanks for the help/update. No further recommendations.

## 2019-08-19 NOTE — Assessment & Plan Note (Signed)
Acute versus chronic anemia with acute blood loss anemia.  Hemoglobin is low 7.0 a day of discharge.  He has been on iron.  He stopped taking iron/ran out about 2 days ago.  At this point I will have him hold iron as we can recheck CBC, ferritin, iron.  This will likely be checked at the facility.  We are requested to fax [place] the results.  If iron is low he may need to resume his iron.  Further recommendations as per colonoscopy.  Follow-up in 4 months.

## 2019-08-20 ENCOUNTER — Telehealth: Payer: Self-pay | Admitting: *Deleted

## 2019-08-20 ENCOUNTER — Other Ambulatory Visit: Payer: Self-pay | Admitting: *Deleted

## 2019-08-20 DIAGNOSIS — R109 Unspecified abdominal pain: Secondary | ICD-10-CM

## 2019-08-20 DIAGNOSIS — K5792 Diverticulitis of intestine, part unspecified, without perforation or abscess without bleeding: Secondary | ICD-10-CM

## 2019-08-20 DIAGNOSIS — D649 Anemia, unspecified: Secondary | ICD-10-CM

## 2019-08-20 MED ORDER — PEG 3350-KCL-NA BICARB-NACL 420 G PO SOLR: ORAL | 0 refills | Status: DC

## 2019-08-20 NOTE — Telephone Encounter (Signed)
Noted. Orders entered. Rx with instructions printed off. Awaiting call from endo regarding pre-op/covid test.

## 2019-08-20 NOTE — Telephone Encounter (Signed)
Spoke with Walter Allen in endo. They will do patient covid and pre-op same day as procedure. covid scheduled for 10:30am and pre-op at 11:00am. Patient will need to go through line for covid then head in main entrance to short stay for pre-op appt. Everything has been faxed to [name].

## 2019-08-20 NOTE — Telephone Encounter (Signed)
I spoke with [name] and the procedure has been approved.  Please schedule the patient for 12/11 and fax all instructions to [phone].  [name] is aware of the procedure date.

## 2019-08-20 NOTE — Telephone Encounter (Signed)
Received VM from [name] and she requested a call back regarding patient.  Called [name] back but line would ring once then would get a busy signal. I tried calling multiple times.

## 2019-08-20 NOTE — Addendum Note (Signed)
Addended by: Cheron Every on: 08/20/2019 09:44 AM   Modules accepted: Orders

## 2019-08-21 ENCOUNTER — Encounter: Payer: Self-pay | Admitting: *Deleted

## 2019-08-21 NOTE — Telephone Encounter (Signed)
Spoke with [name]. She wanted to confirm patient pre-op/covid test was the same day as procedure. She did receive the fax but she just wanted to confirm.

## 2019-08-21 NOTE — Telephone Encounter (Signed)
Updated instructions faxed to [name] since patient needs to do prep all day before. Called to let know but she has left for the day

## 2019-08-22 NOTE — Telephone Encounter (Signed)
Called spoke with Walter Allen. She did receive updated prep instructions.

## 2019-08-26 NOTE — Pre-Procedure Instructions (Signed)
Information faxed to Walter Allen @ [phone] concerning time of arrival and meds to take for procedure on 08/29/2019. Left contact information for [place] if she has any questions on fax cover sheet.

## 2019-08-26 NOTE — Patient Instructions (Signed)
Walter Allen  08/26/2019     @PREFPERIOPPHARMACY @   Your procedure is scheduled on  08/29/2019 .  Report to [facility] at 1030  A.M. Covid test to be done at 1030 and preop to be done at 1100  Call this number if you have problems the morning of surgery:  [phone]   Remember:  Follow the diet and prep instructions given to you by Dr Roseanne Kaufman ofice.                   Take these medicines the morning of surgery with A SIP OF WATER  Isosorbide, metoprolol, prilosec.    Do not wear jewelry, make-up or nail polish.  Do not wear lotions, powders, or perfumes. Please wear deodorant and brush your teth.  Do not shave 48 hours prior to surgery.  Men may shave face and neck.  Do not bring valuables to the hospital.  [facility] is not responsible for any belongings or valuables.  Contacts, dentures or bridgework may not be worn into surgery.     Patients discharged the day of surgery will not be allowed to drive home.   Name and phone number of your driver:   Staff Special instructions:  None  Please read over the following fact sheets that you were given. Anesthesia Post-op Instructions and Care and Recovery After Surgery.     Colonoscopy, Adult, Care After This sheet gives you information about how to care for yourself after your procedure. Your health care provider may also give you more specific instructions. If you have problems or questions, contact your health care provider. What can I expect after the procedure? After the procedure, it is common to have:  A small amount of blood in your stool for 24 hours after the procedure.  Some gas.  Mild abdominal cramping or bloating. Follow these instructions at home: General instructions  For the first 24 hours after the procedure: ? Do not drive or use machinery. ? Do not sign important documents. ? Do not drink alcohol. ? Do your regular daily activities at a slower pace than normal. ? Eat soft, easy-to-digest  foods.  Take over-the-counter or prescription medicines only as told by your health care provider. Relieving cramping and bloating   Try walking around when you have cramps or feel bloated.  Apply heat to your abdomen as told by your health care provider. Use a heat source that your health care provider recommends, such as a moist heat pack or a heating pad. ? Place a towel between your skin and the heat source. ? Leave the heat on for 20-30 minutes. ? Remove the heat if your skin turns bright red. This is especially important if you are unable to feel pain, heat, or cold. You may have a greater risk of getting burned. Eating and drinking   Drink enough fluid to keep your urine pale yellow.  Resume your normal diet as instructed by your health care provider. Avoid heavy or fried foods that are hard to digest.  Avoid drinking alcohol for as long as instructed by your health care provider. Contact a health care provider if:  You have blood in your stool 2-3 days after the procedure. Get help right away if:  You have more than a small spotting of blood in your stool.  You pass large blood clots in your stool.  Your abdomen is swollen.  You have nausea or vomiting.  You have a fever.  You have increasing  abdominal pain that is not relieved with medicine. Summary  After the procedure, it is common to have a small amount of blood in your stool. You may also have mild abdominal cramping and bloating.  For the first 24 hours after the procedure, do not drive or use machinery, sign important documents, or drink alcohol.  Contact your health care provider if you have a lot of blood in your stool, nausea or vomiting, a fever, or increased abdominal pain. This information is not intended to replace advice given to you by your health care provider. Make sure you discuss any questions you have with your health care provider. Document Released: 04/18/2004 Document Revised: 06/27/2017  Document Reviewed: 11/16/2015 Elsevier Patient Education  2020 [place] After These instructions provide you with information about caring for yourself after your procedure. Your health care provider may also give you more specific instructions. Your treatment has been planned according to current medical practices, but problems sometimes occur. Call your health care provider if you have any problems or questions after your procedure. What can I expect after the procedure? After your procedure, you may:  Feel sleepy for several hours.  Feel clumsy and have poor balance for several hours.  Feel forgetful about what happened after the procedure.  Have poor judgment for several hours.  Feel nauseous or vomit.  Have a sore throat if you had a breathing tube during the procedure. Follow these instructions at home: For at least 24 hours after the procedure:      Have a responsible adult stay with you. It is important to have someone help care for you until you are awake and alert.  Rest as needed.  Do not: ? Participate in activities in which you could fall or become injured. ? Drive. ? Use heavy machinery. ? Drink alcohol. ? Take sleeping pills or medicines that cause drowsiness. ? Make important decisions or sign legal documents. ? Take care of children on your own. Eating and drinking  Follow the diet that is recommended by your health care provider.  If you vomit, drink water, juice, or soup when you can drink without vomiting.  Make sure you have little or no nausea before eating solid foods. General instructions  Take over-the-counter and prescription medicines only as told by your health care provider.  If you have sleep apnea, surgery and certain medicines can increase your risk for breathing problems. Follow instructions from your health care provider about wearing your sleep device: ? Anytime you are sleeping,  including during daytime naps. ? While taking prescription pain medicines, sleeping medicines, or medicines that make you drowsy.  If you smoke, do not smoke without supervision.  Keep all follow-up visits as told by your health care provider. This is important. Contact a health care provider if:  You keep feeling nauseous or you keep vomiting.  You feel light-headed.  You develop a rash.  You have a fever. Get help right away if:  You have trouble breathing. Summary  For several hours after your procedure, you may feel sleepy and have poor judgment.  Have a responsible adult stay with you for at least 24 hours or until you are awake and alert. This information is not intended to replace advice given to you by your health care provider. Make sure you discuss any questions you have with your health care provider. Document Released: 12/26/2015 Document Revised: 12/03/2017 Document Reviewed: 12/26/2015 Elsevier Patient Education  [place].

## 2019-08-28 ENCOUNTER — Encounter (HOSPITAL_COMMUNITY): Payer: Self-pay | Admitting: Anesthesiology

## 2019-08-29 ENCOUNTER — Encounter (HOSPITAL_COMMUNITY)
Admission: RE | Admit: 2019-08-29 | Discharge: 2019-08-29 | Disposition: A | Discharge status: Home / Self Care | Source: Ambulatory Visit | Attending: Internal Medicine | Admitting: Internal Medicine

## 2019-08-29 ENCOUNTER — Ambulatory Visit (HOSPITAL_COMMUNITY)
Admission: RE | Admit: 2019-08-29 | Discharge: 2019-08-29 | Disposition: A | Discharge status: Home / Self Care | Attending: Internal Medicine | Admitting: Internal Medicine

## 2019-08-29 ENCOUNTER — Other Ambulatory Visit (HOSPITAL_COMMUNITY): Payer: Self-pay

## 2019-08-29 ENCOUNTER — Encounter (HOSPITAL_COMMUNITY): Payer: Self-pay

## 2019-08-29 ENCOUNTER — Other Ambulatory Visit (HOSPITAL_COMMUNITY)
Admission: RE | Admit: 2019-08-29 | Discharge: 2019-08-29 | Disposition: A | Discharge status: Home / Self Care | Source: Ambulatory Visit | Attending: Internal Medicine | Admitting: Internal Medicine

## 2019-08-29 ENCOUNTER — Other Ambulatory Visit (HOSPITAL_COMMUNITY)

## 2019-08-29 ENCOUNTER — Encounter (HOSPITAL_COMMUNITY)
Admission: RE | Disposition: A | Discharge status: Home / Self Care | Payer: Self-pay | Source: Home / Self Care | Attending: Internal Medicine

## 2019-08-29 ENCOUNTER — Other Ambulatory Visit: Payer: Self-pay

## 2019-08-29 DIAGNOSIS — U071 COVID-19: Secondary | ICD-10-CM | POA: Insufficient documentation

## 2019-08-29 DIAGNOSIS — R109 Unspecified abdominal pain: Secondary | ICD-10-CM | POA: Insufficient documentation

## 2019-08-29 DIAGNOSIS — D649 Anemia, unspecified: Secondary | ICD-10-CM | POA: Diagnosis not present

## 2019-08-29 DIAGNOSIS — R9389 Abnormal findings on diagnostic imaging of other specified body structures: Secondary | ICD-10-CM | POA: Insufficient documentation

## 2019-08-29 DIAGNOSIS — Z01812 Encounter for preprocedural laboratory examination: Secondary | ICD-10-CM | POA: Diagnosis present

## 2019-08-29 LAB — BASIC METABOLIC PANEL
Anion gap: 9 (ref 5–15)
BUN: 12 mg/dL (ref 6–20)
CO2: 27 mmol/L (ref 22–32)
Calcium: 9.1 mg/dL (ref 8.9–10.3)
Chloride: 103 mmol/L (ref 98–111)
Creatinine, Ser: 0.91 mg/dL (ref 0.61–1.24)
GFR calc Af Amer: >60 mL/min (ref >60)
GFR calc non Af Amer: >60 mL/min (ref >60)
Glucose, Bld: 108 mg/dL — ABNORMAL HIGH (ref 70–99)
Potassium: 4.4 mmol/L (ref 3.5–5.1)
Sodium: 139 mmol/L (ref 135–145)

## 2019-08-29 LAB — CBC WITH DIFFERENTIAL/PLATELET
Abs Immature Granulocytes: 0.02 10*3/uL (ref 0.00–0.07)
Basophils Absolute: 0.1 10*3/uL (ref 0.0–0.1)
Basophils Relative: 1 %
Eosinophils Absolute: 0.1 10*3/uL (ref 0.0–0.5)
Eosinophils Relative: 2 %
HCT: 38.5 % — ABNORMAL LOW (ref 39.0–52.0)
Hemoglobin: 12.2 g/dL — ABNORMAL LOW (ref 13.0–17.0)
Immature Granulocytes: 0 %
Lymphocytes Relative: 20 %
Lymphs Abs: 1 10*3/uL (ref 0.7–4.0)
MCH: 30.1 pg (ref 26.0–34.0)
MCHC: 31.7 g/dL (ref 30.0–36.0)
MCV: 95.1 fL (ref 80.0–100.0)
Monocytes Absolute: 0.7 10*3/uL (ref 0.1–1.0)
Monocytes Relative: 16 %
Neutro Abs: 2.9 10*3/uL (ref 1.7–7.7)
Neutrophils Relative %: 61 %
Platelets: 260 10*3/uL (ref 150–400)
RBC: 4.05 MIL/uL — ABNORMAL LOW (ref 4.22–5.81)
RDW: 12.1 % (ref 11.5–15.5)
WBC: 4.7 10*3/uL (ref 4.0–10.5)
nRBC: 0 % (ref 0.0–0.2)

## 2019-08-29 LAB — IRON AND TIBC
Iron: 46 ug/dL (ref 45–182)
Saturation Ratios: 12 % — ABNORMAL LOW (ref 17.9–39.5)
TIBC: 385 ug/dL (ref 250–450)
UIBC: 339 ug/dL

## 2019-08-29 LAB — SARS CORONAVIRUS 2 BY RT PCR (HOSPITAL ORDER, PERFORMED IN ~~LOC~~ HOSPITAL LAB): SARS Coronavirus 2: POSITIVE — AB

## 2019-08-29 LAB — FERRITIN: Ferritin: 10 ng/mL — ABNORMAL LOW (ref 24–336)

## 2019-08-29 SURGERY — COLONOSCOPY WITH PROPOFOL
Anesthesia: Monitor Anesthesia Care

## 2019-08-29 NOTE — Progress Notes (Signed)
Patient here for EGD and colonoscopy.  Multiple delays previously due to patient not having transportation for the procedures.  Unfortunately, Covid positive today.  This will preclude endoscopic evaluation today.  He will circle back around and follow-up in the office and get rescheduled sometime the first of next year.

## 2019-08-29 NOTE — OR Nursing (Signed)
Covid test came back positive. Spoke with officer that was with Walter Allen. Procedure cancelled. Office notified. Patient will be rescheduled.

## 2019-09-01 ENCOUNTER — Encounter: Payer: Self-pay | Admitting: Internal Medicine

## 2019-09-09 ENCOUNTER — Telehealth: Payer: Self-pay

## 2019-09-09 ENCOUNTER — Encounter: Payer: Self-pay | Admitting: Nurse Practitioner

## 2019-09-09 NOTE — Telephone Encounter (Signed)
Yes we can schedule. He was seen a couple weeks ago. Please schedule urgent TCS w/ propofol with RMR. We have been trying to get it done for some time now and delays due to [facility] with [facility] and COVID+ (now negative).   Clinical concerns for urgent: Acute anemia, weight loss, colon wall thickening on CT.

## 2019-09-09 NOTE — Telephone Encounter (Signed)
Routing to CM for assistance with scheduling urgent TCS (FYI-rapid COVID test at [facility] was positive 08/29/19, correctional facility done rapid morning of procedure that was negative. Had COVID test 09/05/19 that wasn't rapid and it was negative from [place]). Result to be scanned.

## 2019-09-09 NOTE — Progress Notes (Signed)
Letter sent to correctional facility to "justify" OV prior to procedure

## 2019-09-09 NOTE — Telephone Encounter (Signed)
Please schedule for 1/19 at 7:45 am  Routing to [place]

## 2019-09-09 NOTE — Telephone Encounter (Signed)
Letter printed and given to MB

## 2019-09-09 NOTE — Telephone Encounter (Signed)
After review of chart, RMR wants pt to have OV as he recommends EGD in addition to TCS d/t anemia. EG advised for OV prior to scheduling. Called and informed Walter Allen. She needs a note from provider stating reason OV is needed and what will take place at [place]. Note can be on letter head from our office. Fax# [phone].  Randall Hiss, can you please type letter to be sent to nurse explaining need for another OV and what will be done day of OV so she can submit for approval?

## 2019-09-09 NOTE — Telephone Encounter (Signed)
Received COVID result done by [facility], specimen collected 09/05/19. Result "not detected".  Randall Hiss, may we reschedule TCS w/Propofol w/RMR or does he need OV to reschedule?

## 2019-09-09 NOTE — Telephone Encounter (Signed)
Ms. Purcell Mouton at correctional facility called office to reschedule TCS. Informed her pt is scheduled for appt 10/06/19 in office to reschedule procedure. She wasn't aware of OV and doesn't have an approval. She said they done rapid COVID test on pt the morning of TCS and it was negative. He also had a random COVID test 09/05/19 that wasn't rapid and was sent to [facility], result was negative. She advised he had a positive result in the past. She will fax results to our office. [phone], ext [phone].

## 2019-09-09 NOTE — Telephone Encounter (Signed)
Letter faxed to Ms. Hurdle. She was informed of OV 10/06/19 earlier today.

## 2019-09-30 ENCOUNTER — Other Ambulatory Visit: Payer: Self-pay

## 2019-10-06 ENCOUNTER — Encounter: Payer: Self-pay | Admitting: Nurse Practitioner

## 2019-10-06 ENCOUNTER — Ambulatory Visit (INDEPENDENT_AMBULATORY_CARE_PROVIDER_SITE_OTHER): Admitting: Nurse Practitioner

## 2019-10-06 ENCOUNTER — Other Ambulatory Visit: Payer: Self-pay

## 2019-10-06 ENCOUNTER — Telehealth: Payer: Self-pay

## 2019-10-06 VITALS — BP 108/70 | HR 60 | Temp 97.3°F | Ht 70.0 in | Wt 217.8 lb

## 2019-10-06 DIAGNOSIS — K5792 Diverticulitis of intestine, part unspecified, without perforation or abscess without bleeding: Secondary | ICD-10-CM | POA: Diagnosis not present

## 2019-10-06 DIAGNOSIS — D649 Anemia, unspecified: Secondary | ICD-10-CM | POA: Diagnosis not present

## 2019-10-06 NOTE — Patient Instructions (Signed)
Your health issues we discussed today were:   Need for colonoscopy and upper endoscopy because of anemia and previous diverticulitis: 1. We will schedule your procedures 40 2. Further recommendations will follow your procedures 3. Call us if you have any worsening symptoms such as obvious bleeding in your stools, worsening lab results, etc.  Overall I recommend:  1. Continue your other current medications 2. Return for follow-up based on recommendations made after your procedure 3. Call us if you have any questions or concerns.   ---------------------------------------------------------------  COVID-19 Vaccine Information can be found at: [email or web address].co.uk For questions related to vaccine distribution or appointments, please email [email or web address] or call [phone].   ---------------------------------------------------------------  At [facility] we value your feedback. You may receive a survey about your visit today. Please share your experience as we strive to create trusting relationships with our patients to provide genuine, compassionate, quality care.  We appreciate your understanding and patience as we review any laboratory studies, imaging, and other diagnostic tests that are ordered as we care for you. Our office policy is 5 business days for review of these results, and any emergent or urgent results are addressed in a timely manner for your best interest. If you do not hear from our office in 1 week, please contact us.   We also encourage the use of MyChart, which contains your medical information for your review as well. If you are not enrolled in this feature, an access code is on this after visit summary for your convenience. Thank you for allowing [place] to be involved in your care.  It was great to see you today!  I hope you have a Happy New Year!!

## 2019-10-06 NOTE — Assessment & Plan Note (Signed)
Previous CT imaging documenting acute sigmoid diverticulitis need to rule out malignancy/mass, as per HPI.  We have made multiple attempts to schedule and due to scheduling conflicts, [id number] lockdown at the jail, and false positive Covid test prior to procedure is been sometime since we have been able to get this scheduled.  We will again proceed with colonoscopy attempt to further evaluate his colon.  Return for follow-up based on post procedure recommendations.  Proceed with TCS with Dr. Gala Romney in near future: the risks, benefits, and alternatives have been discussed with the patient in detail. The patient states understanding and desires to proceed.  Patient is not on any anticoagulants, anxiolytics, chronic pain medications, antidepressants, antidiabetics, or iron supplements.  Denies alcohol or drug use.  Conscious sedation should be adequate for his procedure.

## 2019-10-06 NOTE — Progress Notes (Signed)
Referring Provider: No ref. provider found Primary Care Physician:  Patient, No Pcp Per Primary GI:  Dr. Gala Romney   Chief Complaint  Patient presents with  . Abdominal Pain    lower abd    HPI:   Walter Allen is a 59 y.o. male who presents to reschedule procedures.  Patient was last seen in our office 08/19/2019 for acute sigmoid diverticulitis with need to rule out malignancy mass, acute anemia versus chronic anemia, change in stool.  Initially presented for abdominal pain and diverticulitis with acute anemia versus chronic anemia with superimposed acute blood loss anemia.  Admitted to the hospital 02/14/2019 through 02/17/2019.  Found sigmoid diverticulitis with possible underlying colorectal mass treated with antibiotics.  No previous colonoscopy.  Hemoglobin at discharge was 7.0.  We have been attempting to schedule with the patient's colonoscopy.  He has had a couple of reschedules due to coordinating with the correctional facility.  At his last visit he noted still with mid to lower abdominal pain although weight loss is stabilized.  His pain is sharp and crampy.  Stools are "changing all over the place."  Ranging from [facility] 1-7 with flatulence.  Out of 10 stools he would expect 3 to be constipated, 4 L, 3 normal.  Some improvement in abdominal pain with urination.  Everything is "black" related to his stools because he is on iron and he is color blind.  No other overt GI complaints.  Recommended hold iron for now as he had recently run out.  Have labs drawn as soon as he can and fax results to [place], depending on results may need to restart iron.  Reschedule colonoscopy.  Continue stool softeners and use Imodium if any diarrhea.  Follow-up in 4 months.  He was scheduled for colonoscopy 08/29/2019.  However, his Covid test at [facility] was positive.  Confirmation testing completed by the correctional facility was negative.  He did have Covid test 09/05/2019 as well that was negative.  He was  scheduled for another office appointment due to likely need for EGD in addition to colonoscopy due to anemia.  Today he states he's doing well overall. Still with some lower abdominal discomfort when he has the urge to have a bowel movement. Basically with anything associated with pressure (defecate, urinate, pass gas, etc). Improves after relieving himself. Denies N/V, hematochezia, melena, fever, chills, unintentional weight loss. Stools still "all over the place." He states they took blood a while back.   He was told by facility his labs were ok and to hold his iron.  Past Medical History:  Diagnosis Date  . Abscess of mouth   . Arthritis   . Cellulitis of mouth   . Chalazion   . Chronic rhinitis   . Corns and callosities   . Coronary artery disease   . Cough   . Functional dyspepsia   . GERD (gastroesophageal reflux disease)   . Heart disease    severe left ventricular systolic dysfunction after anterior MI  . Hemoptysis   . Hyperlipidemia   . Hypertension   . Joint pain   . Presbyopia   . STEMI (ST elevation myocardial infarction) ([place]) 2016  . Tinea unguium   . Toe pain   . Vitamin D deficiency     Past Surgical History:  Procedure Laterality Date  . CORONARY STENT PLACEMENT  2016    Current Outpatient Medications  Medication Sig Dispense Refill  . acetaminophen (TYLENOL) 650 MG CR tablet Take 1,300 mg  by mouth 3 (three) times daily.    Marland Kitchen aspirin EC 81 MG tablet Take 1 tablet (81 mg total) by mouth daily with breakfast. 30 tablet 2  . atorvastatin (LIPITOR) 80 MG tablet Take 80 mg by mouth daily.    Marland Kitchen docusate sodium (COLACE) 100 MG capsule Take 100 mg by mouth 2 (two) times daily.    . furosemide (LASIX) 20 MG tablet Take 20 mg by mouth daily.     . isosorbide mononitrate (IMDUR) 30 MG 24 hr tablet Take 30 mg by mouth daily.    . metoprolol succinate (TOPROL-XL) 25 MG 24 hr tablet Take 25 mg by mouth daily.    . nitroGLYCERIN (NITROSTAT) 0.4 MG SL tablet Place 0.4  mg under the tongue every 5 (five) minutes as needed for chest pain.    Marland Kitchen omeprazole (PRILOSEC) 20 MG capsule Take 1 capsule (20 mg total) by mouth 2 (two) times daily before a meal. 60 capsule 1  . spironolactone (ALDACTONE) 25 MG tablet Take 25 mg by mouth daily.    . ferrous sulfate 325 (65 FE) MG tablet 1 tablet twice a day with meals (Patient not taking: Reported on 10/06/2019) 60 tablet 3  . polyethylene glycol-electrolytes (NULYTELY/GOLYTELY) 420 g solution As directed (Patient not taking: Reported on 10/06/2019) 4000 mL 0   No current facility-administered medications for this visit.    Allergies as of 10/06/2019 - Review Complete 10/06/2019  Allergen Reaction Noted  . Collard greens [lactuca virosa]  02/14/2019    Family History  Problem Relation Age of Onset  . Colon cancer Neg Hx   . Colon polyps Neg Hx     Social History   Socioeconomic History  . Marital status: Single    Spouse name: Not on file  . Number of children: Not on file  . Years of education: Not on file  . Highest education level: Some college, no degree  Occupational History  . Not on file  Tobacco Use  . Smoking status: Former Smoker    Packs/day: 1.00    Years: 40.00    Pack years: 40.00    Types: Cigarettes    Quit date: 01/22/2006    Years since quitting: 13.7  . Smokeless tobacco: Never Used  Substance and Sexual Activity  . Alcohol use: Never  . Drug use: Never  . Sexual activity: Never  Other Topics Concern  . Not on file  Social History Narrative  . Not on file   Social Determinants of Health   Financial Resource Strain:   . Difficulty of Paying Living Expenses: Not on file  Food Insecurity:   . Worried About Charity fundraiser in the Last Year: Not on file  . Ran Out of Food in the Last Year: Not on file  Transportation Needs:   . Lack of Transportation (Medical): Not on file  . Lack of Transportation (Non-Medical): Not on file  Physical Activity:   . Days of Exercise per  Week: Not on file  . Minutes of Exercise per Session: Not on file  Stress:   . Feeling of Stress : Not on file  Social Connections:   . Frequency of Communication with Friends and Family: Not on file  . Frequency of Social Gatherings with Friends and Family: Not on file  . Attends Religious Services: Not on file  . Active Member of Clubs or Organizations: Not on file  . Attends Archivist Meetings: Not on file  . Marital Status: Not  on file    Review of Systems: General: Negative for anorexia, weight loss, fever, chills, fatigue, weakness. ENT: Negative for hoarseness, difficulty swallowing. CV: Negative for chest pain, angina, palpitations, peripheral edema.  Respiratory: Negative for dyspnea at rest, cough, sputum, wheezing.  GI: See history of present illness. Endo: Negative for unusual weight change.  Heme: Negative for bruising or bleeding. Allergy: Negative for rash or hives.   Physical Exam: BP 108/70   Pulse 60   Temp (!) 97.3 F (36.3 C) (Temporal)   Ht 5\' 10"  (1.778 m)   Wt 217 lb 12.8 oz (98.8 kg)   BMI 31.25 kg/m  General:   Alert and oriented. Pleasant and cooperative. Well-nourished and well-developed.  Eyes:  Without icterus, sclera clear and conjunctiva pink.  Ears:  Normal auditory acuity. Cardiovascular:  S1, S2 present without murmurs appreciated. Extremities without clubbing or edema. Respiratory:  Clear to auscultation bilaterally. No wheezes, rales, or rhonchi. No distress.  Gastrointestinal:  +BS, soft, non-tender and non-distended. No HSM noted. No guarding or rebound. No masses appreciated.  Rectal:  Deferred  Musculoskalatal:  Symmetrical without gross deformities. Neurologic:  Alert and oriented x4;  grossly normal neurologically. Psych:  Alert and cooperative. Normal mood and affect. Heme/Lymph/Immune: No excessive bruising noted.    10/06/2019 1:55 PM   Disclaimer: This note was dictated with voice recognition software. Similar  sounding words can inadvertently be transcribed and may not be corrected upon review.

## 2019-10-06 NOTE — Assessment & Plan Note (Signed)
Likely acute on chronic anemia.  He is, iron and the jail facility said his labs were, better".  We do not have copies of his labs at this time.  Acute versus chronic anemia with acute blood loss anemia.  He needs to have a colonoscopy as per above.  Hold off on iron pending hemoglobin trend which should be managed by the facility.  We will add an upper endoscopy due to his anemia to ensure no bleeding from his upper GI tract.  Follow-up based on post procedure recommendations.  Proceed with EGD with Dr. Gala Romney in near future: the risks, benefits, and alternatives have been discussed with the patient in detail. The patient states understanding and desires to proceed.  Patient is not on any anticoagulants, anxiolytics, chronic pain medications, antidepressants, antidiabetics, or iron supplements.  Denies alcohol or drug use.  Conscious sedation should be adequate for his procedure.

## 2019-10-06 NOTE — Telephone Encounter (Signed)
Tried to call Walter Allen at facility but she if off today. Will call back later.

## 2019-10-07 NOTE — Telephone Encounter (Signed)
Will call Walter Allen when opening is available on procedure schedule.

## 2019-10-13 ENCOUNTER — Telehealth: Payer: Self-pay

## 2019-10-13 NOTE — Telephone Encounter (Signed)
Called Walter Allen, TCS/EGD w/Propofol w/RMR scheduled for 10/30/19 at 12:00pm. Orders entered.  Informed endo scheduler, pt is incarcerated. Pre-op nurse will call Walter Allen 10/28/19, pre-op will be done day of procedure. No COVID test needed as he was positive 08/28/20.  Called Walter Allen back and informed her of pre-op phone call. No need for COVID test but ok it they want to test him prior to procedure. Rx for prep and instructions faxed to Walter Allen [phone]).

## 2019-10-28 ENCOUNTER — Other Ambulatory Visit: Payer: Self-pay

## 2019-10-28 ENCOUNTER — Encounter (HOSPITAL_COMMUNITY)
Admission: RE | Admit: 2019-10-28 | Discharge: 2019-10-28 | Disposition: A | Discharge status: Home / Self Care | Source: Ambulatory Visit | Attending: Internal Medicine | Admitting: Internal Medicine

## 2019-10-28 NOTE — Pre-Procedure Instructions (Signed)
Spoke with Ms Walter Allen at [facility] concerning time of arrival for procedure and other preop instructions. They have prep instructions that were faxed from [facility] and have no questions concerning these.

## 2019-10-29 ENCOUNTER — Encounter (HOSPITAL_COMMUNITY): Payer: Self-pay | Admitting: Anesthesiology

## 2019-10-29 ENCOUNTER — Telehealth: Payer: Self-pay | Admitting: Internal Medicine

## 2019-10-29 NOTE — Telephone Encounter (Signed)
LMOVM for endo scheduler to cancel procedure for 10/30/19.

## 2019-10-29 NOTE — Telephone Encounter (Signed)
Routing to EG 

## 2019-10-29 NOTE — Telephone Encounter (Signed)
Nurse Frutoso Schatz from  [facility] called to reschedule patient's procedure. He is scheduled with RMR tomorrow 10/30/2019. [phone]

## 2019-10-29 NOTE — Telephone Encounter (Signed)
I called and spoke to the nurse at the prison. She states the patient ate solid food. I asked for clarification if the prisoner selects their own food or if he's provided a tray. She states he is provided a tray and confirmed someone gave him a solid food tray despite scheduled procedure tomorrow. I reminded them we're trying to get the colonoscopy and EGD done since last May due to CT findings concerning for possible CRC as well as anemia.  RMR: The next available is an opening end of Feb, but you have 2 doubles already that day; are we ok to add a third case?  Will need to be rescheduled. Cc: RMR and CM

## 2019-10-30 ENCOUNTER — Encounter (HOSPITAL_COMMUNITY): Admission: RE | Payer: Self-pay | Source: Home / Self Care

## 2019-10-30 ENCOUNTER — Ambulatory Visit (HOSPITAL_COMMUNITY): Admission: RE | Admit: 2019-10-30 | Source: Home / Self Care | Admitting: Internal Medicine

## 2019-10-30 SURGERY — COLONOSCOPY WITH PROPOFOL
Anesthesia: Monitor Anesthesia Care

## 2019-10-30 NOTE — Telephone Encounter (Signed)
Please schedule the patient on 2/24 at 2:00 per [facility]

## 2019-10-30 NOTE — Telephone Encounter (Signed)
Procedure with conscious sedation per EG.

## 2019-10-30 NOTE — Telephone Encounter (Signed)
Spoke to Walter Allen. TCS/EGD w/RMR scheduled for 11/12/19 at 2:00pm. New instructions faxed to [phone]. Orders entered.

## 2019-10-30 NOTE — Telephone Encounter (Signed)
Noted  

## 2019-10-30 NOTE — Telephone Encounter (Signed)
Called Walter Allen at facility. Offered 11/12/19 at 2:00pm. She will call office back.

## 2019-10-30 NOTE — Telephone Encounter (Signed)
2 doubles is my limit on any given full endoscopy day.  Unfortunately, there have been huge gaps in the endoscopy schedule recently where doubles could have been added.  Missed opportunities!

## 2019-11-12 ENCOUNTER — Ambulatory Visit (HOSPITAL_COMMUNITY)
Admission: RE | Admit: 2019-11-12 | Discharge: 2019-11-12 | Disposition: A | Discharge status: Home / Self Care | Attending: Internal Medicine | Admitting: Internal Medicine

## 2019-11-12 ENCOUNTER — Encounter (HOSPITAL_COMMUNITY)
Admission: RE | Disposition: A | Discharge status: Home / Self Care | Payer: Self-pay | Source: Home / Self Care | Attending: Internal Medicine

## 2019-11-12 ENCOUNTER — Encounter (HOSPITAL_COMMUNITY): Payer: Self-pay | Admitting: Internal Medicine

## 2019-11-12 ENCOUNTER — Other Ambulatory Visit: Payer: Self-pay

## 2019-11-12 DIAGNOSIS — I10 Essential (primary) hypertension: Secondary | ICD-10-CM | POA: Insufficient documentation

## 2019-11-12 DIAGNOSIS — D490 Neoplasm of unspecified behavior of digestive system: Secondary | ICD-10-CM | POA: Diagnosis not present

## 2019-11-12 DIAGNOSIS — Z79899 Other long term (current) drug therapy: Secondary | ICD-10-CM | POA: Insufficient documentation

## 2019-11-12 DIAGNOSIS — E785 Hyperlipidemia, unspecified: Secondary | ICD-10-CM | POA: Insufficient documentation

## 2019-11-12 DIAGNOSIS — K621 Rectal polyp: Secondary | ICD-10-CM

## 2019-11-12 DIAGNOSIS — Z87891 Personal history of nicotine dependence: Secondary | ICD-10-CM | POA: Diagnosis not present

## 2019-11-12 DIAGNOSIS — K219 Gastro-esophageal reflux disease without esophagitis: Secondary | ICD-10-CM | POA: Diagnosis not present

## 2019-11-12 DIAGNOSIS — Z955 Presence of coronary angioplasty implant and graft: Secondary | ICD-10-CM | POA: Insufficient documentation

## 2019-11-12 DIAGNOSIS — Z7982 Long term (current) use of aspirin: Secondary | ICD-10-CM | POA: Insufficient documentation

## 2019-11-12 DIAGNOSIS — I252 Old myocardial infarction: Secondary | ICD-10-CM | POA: Diagnosis not present

## 2019-11-12 DIAGNOSIS — C19 Malignant neoplasm of rectosigmoid junction: Secondary | ICD-10-CM | POA: Diagnosis present

## 2019-11-12 DIAGNOSIS — I2510 Atherosclerotic heart disease of native coronary artery without angina pectoris: Secondary | ICD-10-CM | POA: Insufficient documentation

## 2019-11-12 DIAGNOSIS — M1990 Unspecified osteoarthritis, unspecified site: Secondary | ICD-10-CM | POA: Insufficient documentation

## 2019-11-12 DIAGNOSIS — D509 Iron deficiency anemia, unspecified: Secondary | ICD-10-CM

## 2019-11-12 HISTORY — PX: ESOPHAGOGASTRODUODENOSCOPY: SHX5428

## 2019-11-12 HISTORY — PX: COLONOSCOPY: SHX5424

## 2019-11-12 HISTORY — PX: BIOPSY: SHX5522

## 2019-11-12 HISTORY — PX: POLYPECTOMY: SHX5525

## 2019-11-12 SURGERY — COLONOSCOPY
Anesthesia: Moderate Sedation

## 2019-11-12 MED ORDER — ONDANSETRON HCL 4 MG/2ML IJ SOLN
INTRAMUSCULAR | Status: DC | PRN
  Administered 2019-11-12: 4 mg via INTRAVENOUS

## 2019-11-12 MED ORDER — MIDAZOLAM HCL 5 MG/5ML IJ SOLN
INTRAMUSCULAR | Status: DC | PRN
  Administered 2019-11-12 (×3): 2 mg via INTRAVENOUS
  Administered 2019-11-12 (×3): 1 mg via INTRAVENOUS

## 2019-11-12 MED ORDER — SODIUM CHLORIDE 0.9 % IV SOLN
INTRAVENOUS | Status: DC
  Administered 2019-11-12: 1000 mL via INTRAVENOUS

## 2019-11-12 MED ORDER — ONDANSETRON HCL 4 MG/2ML IJ SOLN
INTRAMUSCULAR | Status: AC
  Filled 2019-11-12: qty 2

## 2019-11-12 MED ORDER — LIDOCAINE VISCOUS HCL 2 % MT SOLN
OROMUCOSAL | Status: AC
  Filled 2019-11-12: qty 15

## 2019-11-12 MED ORDER — LIDOCAINE VISCOUS HCL 2 % MT SOLN
OROMUCOSAL | Status: DC | PRN
  Administered 2019-11-12: 4 mL via OROMUCOSAL

## 2019-11-12 MED ORDER — MIDAZOLAM HCL 5 MG/5ML IJ SOLN
INTRAMUSCULAR | Status: AC
  Filled 2019-11-12: qty 10

## 2019-11-12 MED ORDER — MEPERIDINE HCL 50 MG/ML IJ SOLN
INTRAMUSCULAR | Status: AC
  Filled 2019-11-12: qty 1

## 2019-11-12 MED ORDER — STERILE WATER FOR IRRIGATION IR SOLN
Status: DC | PRN
  Administered 2019-11-12: 100 mL

## 2019-11-12 MED ORDER — MEPERIDINE HCL 100 MG/ML IJ SOLN
INTRAMUSCULAR | Status: DC | PRN
  Administered 2019-11-12: 25 mg via INTRAVENOUS
  Administered 2019-11-12: 10 mg via INTRAVENOUS
  Administered 2019-11-12: 15 mg via INTRAVENOUS

## 2019-11-12 NOTE — H&P (Signed)
@LOGO @   Primary Care Physician:  Patient, No Pcp Per Primary Gastroenterologist:  Dr. Gala Romney  Pre-Procedure History & Physical: HPI:  Walter Allen is a 59 y.o. male here for colonoscopy to further evaluate anemia and abnormal sigmoid colon on CT.  Will also undergo an EGD for further evaluation of anemia per original plan.  Past Medical History:  Diagnosis Date  . Abscess of mouth   . Arthritis   . Cellulitis of mouth   . Chalazion   . Chronic rhinitis   . Corns and callosities   . Coronary artery disease   . Cough   . Functional dyspepsia   . GERD (gastroesophageal reflux disease)   . Heart disease    severe left ventricular systolic dysfunction after anterior MI  . Hemoptysis   . Hyperlipidemia   . Hypertension   . Joint pain   . Presbyopia   . STEMI (ST elevation myocardial infarction) ([place]) 2016  . Tinea unguium   . Toe pain   . Vitamin D deficiency     Past Surgical History:  Procedure Laterality Date  . CORONARY STENT PLACEMENT  2016    Prior to Admission medications   Medication Sig Start Date End Date Taking? Authorizing Provider  acetaminophen (TYLENOL) 650 MG CR tablet Take 1,300 mg by mouth 3 (three) times daily.   Yes [provider]  aspirin EC 81 MG tablet Take 1 tablet (81 mg total) by mouth daily with breakfast. 02/17/19 02/17/20 Yes Emokpae, Courage, MD  atorvastatin (LIPITOR) 80 MG tablet Take 80 mg by mouth daily.   Yes [provider]  docusate sodium (COLACE) 100 MG capsule Take 100 mg by mouth 2 (two) times daily.   Yes [provider]  ferrous sulfate 325 (65 FE) MG tablet 1 tablet twice a day with meals Patient taking differently: Take 325 mg by mouth 2 (two) times daily with a meal.  02/17/19  Yes Emokpae, Courage, MD  furosemide (LASIX) 20 MG tablet Take 20 mg by mouth daily.    Yes [provider]  isosorbide mononitrate (IMDUR) 30 MG 24 hr tablet Take 30 mg by mouth daily.   Yes [provider]   metoprolol succinate (TOPROL-XL) 25 MG 24 hr tablet Take 25 mg by mouth daily.   Yes [provider]  nitroGLYCERIN (NITROSTAT) 0.4 MG SL tablet Place 0.4 mg under the tongue every 5 (five) minutes x 3 doses as needed for chest pain.    Yes [provider]  omeprazole (PRILOSEC) 20 MG capsule Take 1 capsule (20 mg total) by mouth 2 (two) times daily before a meal. 02/17/19  Yes Emokpae, Courage, MD  spironolactone (ALDACTONE) 25 MG tablet Take 25 mg by mouth daily.   Yes [provider]  bisacodyl (DULCOLAX) 5 MG EC tablet Take 10 mg by mouth See admin instructions. Take 2 tablets (10 mg) by mouth at 2pm the day before colonoscopy    [provider]  polyethylene glycol-electrolytes (NULYTELY/GOLYTELY) 420 g solution As directed Patient taking differently: Take 4,000 mLs by mouth as directed. On 11/11/19 at 1600 start bowel prep. Drink 8 oz. Every 15 minutes until you have consumed half of Prep. On 11/12/19 at 0900 begin drinking remaining of Prep until all consumed 08/20/19   Rasheema Truluck, [name], MD  Sodium Phosphates (TGT SALINE LAXATIVE) ENEM Place 1 Dose rectally once.    [provider]    Allergies as of 10/30/2019 - Review Complete 10/29/2019  Allergen Reaction Noted  .  Collard greens [lactuca virosa]  02/14/2019    Family History  Problem Relation Age of Onset  . Colon cancer Neg Hx   . Colon polyps Neg Hx     Social History   Socioeconomic History  . Marital status: Single    Spouse name: Not on file  . Number of children: Not on file  . Years of education: Not on file  . Highest education level: Some college, no degree  Occupational History  . Not on file  Tobacco Use  . Smoking status: Former Smoker    Packs/day: 1.00    Years: 40.00    Pack years: 40.00    Types: Cigarettes    Quit date: 01/22/2006    Years since quitting: 13.8  . Smokeless tobacco: Never Used  Substance and Sexual Activity  . Alcohol use: Never  . Drug use:  Never  . Sexual activity: Never  Other Topics Concern  . Not on file  Social History Narrative  . Not on file   Social Determinants of Health   Financial Resource Strain:   . Difficulty of Paying Living Expenses: Not on file  Food Insecurity:   . Worried About Charity fundraiser in the Last Year: Not on file  . Ran Out of Food in the Last Year: Not on file  Transportation Needs:   . Lack of Transportation (Medical): Not on file  . Lack of Transportation (Non-Medical): Not on file  Physical Activity:   . Days of Exercise per Week: Not on file  . Minutes of Exercise per Session: Not on file  Stress:   . Feeling of Stress : Not on file  Social Connections:   . Frequency of Communication with Friends and Family: Not on file  . Frequency of Social Gatherings with Friends and Family: Not on file  . Attends Religious Services: Not on file  . Active Member of Clubs or Organizations: Not on file  . Attends Archivist Meetings: Not on file  . Marital Status: Not on file  Intimate Partner Violence:   . Fear of Current or Ex-Partner: Not on file  . Emotionally Abused: Not on file  . Physically Abused: Not on file  . Sexually Abused: Not on file    Review of Systems: See HPI, otherwise negative ROS  Physical Exam: There were no vitals taken for this visit. General:   Alert,  Well-developed, well-nourished, pleasant and cooperative in NAD SNeck:  Supple; no masses or thyromegaly. No significant cervical adenopathy. Lungs:  Clear throughout to auscultation.   No wheezes, crackles, or rhonchi. No acute distress. Heart:  Regular rate and rhythm; no murmurs, clicks, rubs,  or gallops. Abdomen: Non-distended, normal bowel sounds.  Soft and nontender without appreciable mass or hepatosplenomegaly.  Pulses:  Normal pulses noted. Extremities:  Without clubbing or edema.  Impression/Plan: 59 year old gentleman microcytic anemia abnormal sigmoid colon on CT recently.  Colonoscopy  and EGD now being done to further evaluate microcytic anemia.  The risks, benefits, limitations, imponderables and alternatives regarding both EGD and colonoscopy have been reviewed with the patient. Questions have been answered. All parties agreeable.      Notice: This dictation was prepared with Dragon dictation along with smaller phrase technology. Any transcriptional errors that result from this process are unintentional and may not be corrected upon review.

## 2019-11-12 NOTE — Op Note (Signed)
[facility] Patient Name: Walter Allen Procedure Date: 11/12/2019 1:12 PM MRN: [id number] Date of Birth: [birth year] Attending MD: Norvel Richards , MD CSN: [id number] Age: 59 Admit Type: Outpatient Procedure:                Upper GI endoscopy Indications:              Unexplained iron deficiency anemia Providers:                Norvel Richards, MD, Rosina Lowenstein, RN, Aram Candela Referring MD:              Medicines:                Midazolam 6 mg IV, Meperidine 50 mg IV Complications:            No immediate complications. Estimated Blood Loss:     Estimated blood loss: none. Procedure:                Pre-Anesthesia Assessment:                           - Prior to the procedure, a History and Physical                            was performed, and patient medications and                            allergies were reviewed. The patient's tolerance of                            previous anesthesia was also reviewed. The risks                            and benefits of the procedure and the sedation                            options and risks were discussed with the patient.                            All questions were answered, and informed consent                            was obtained. Prior Anticoagulants: The patient has                            taken no previous anticoagulant or antiplatelet                            agents. ASA Grade Assessment: II - A patient with                            mild systemic disease. After reviewing the risks  and benefits, the patient was deemed in                            satisfactory condition to undergo the procedure.                           After obtaining informed consent, the endoscope was                            passed under direct vision. Throughout the                            procedure, the patient's blood pressure, pulse, and   oxygen saturations were monitored continuously. The                            [id number]) was introduced through the                            mouth, and advanced to the second part of duodenum.                            The upper GI endoscopy was accomplished without                            difficulty. The patient tolerated the procedure                            well. Scope In: 2:22:33 PM Scope Out: 2:25:59 PM Total Procedure Duration: 0 hours 3 minutes 26 seconds  Findings:      The examined esophagus was normal.      The entire examined stomach was normal.      The duodenal bulb and second portion of the duodenum were normal. Impression:               - Normal esophagus.                           - Normal stomach.                           - Normal duodenal bulb and second portion of the                            duodenum.                           - No specimens collected. Moderate Sedation:      Moderate (conscious) sedation was administered by the endoscopy nurse       and supervised by the endoscopist. The following parameters were       monitored: oxygen saturation, heart rate, blood pressure, respiratory       rate, EKG, adequacy of pulmonary ventilation, and response to care.       Total physician intraservice time was 10 minutes. Recommendation:           - Patient has a contact number available  for                            emergencies. The signs and symptoms of potential                            delayed complications were discussed with the                            patient. Return to normal activities tomorrow.                            Written discharge instructions were provided to the                            patient.                           - Advance diet as tolerated.                           - Continue present medications. See colonoscopy                            report. Procedure Code(s):        --- Professional ---                            [id number], Esophagogastroduodenoscopy, flexible,                            transoral; diagnostic, including collection of                            specimen(s) by brushing or washing, when performed                            (separate procedure)                           G0500, Moderate sedation services provided by the                            same physician or other qualified health care                            professional performing a gastrointestinal                            endoscopic service that sedation supports,                            requiring the presence of an independent trained                            observer to assist in the monitoring of the  patient's level of consciousness and physiological                            status; initial 15 minutes of intra-service time;                            patient age 25 years or older (additional time may                            be reported with [phone], as appropriate) Diagnosis Code(s):        --- Professional ---                           D50.9, Iron deficiency anemia, unspecified CPT copyright 2019 American Medical Association. All rights reserved. The codes documented in this report are preliminary and upon coder review may  be revised to meet current compliance requirements. [name]. Cordon Gassett, MD Norvel Richards, MD 11/12/2019 2:59:03 PM This report has been signed electronically. Number of Addenda: 0

## 2019-11-12 NOTE — Op Note (Signed)
[facility] Patient Name: Walter Allen Procedure Date: 11/12/2019 1:11 PM MRN: [id number] Date of Birth: May 17, 1961 Attending MD: Norvel Richards , MD CSN: [id number] Age: 59 Admit Type: Outpatient Procedure:                Colonoscopy Indications:              Abnormal CT of the GI tract Providers:                Norvel Richards, MD, Rosina Lowenstein, RN, Aram Candela Referring MD:              Medicines:                Midazolam 6 mg IV, Meperidine 50 mg IV Complications:            No immediate complications. Estimated Blood Loss:     Estimated blood loss was minimal. Procedure:                Pre-Anesthesia Assessment:                           - Prior to the procedure, a History and Physical                            was performed, and patient medications and                            allergies were reviewed. The patient's tolerance of                            previous anesthesia was also reviewed. The risks                            and benefits of the procedure and the sedation                            options and risks were discussed with the patient.                            All questions were answered, and informed consent                            was obtained. Prior Anticoagulants: The patient has                            taken no previous anticoagulant or antiplatelet                            agents. ASA Grade Assessment: II - A patient with                            mild systemic disease. After reviewing the risks  and benefits, the patient was deemed in                            satisfactory condition to undergo the procedure.                           After obtaining informed consent, the colonoscope                            was passed under direct vision. Throughout the                            procedure, the patient's blood pressure, pulse, and                            oxygen  saturations were monitored continuously. The                            [id number]) scope was introduced through                            the anus and advanced to the the cecum, identified                            by appendiceal orifice and ileocecal valve. The                            colonoscopy was performed without difficulty. The                            patient tolerated the procedure well. The quality                            of the bowel preparation was adequate. Scope In: 2:36:24 PM Scope Out: 2:58:17 PM Scope Withdrawal Time: 0 hours 15 minutes 50 seconds  Total Procedure Duration: 0 hours 21 minutes 53 seconds  Findings:      The perianal and digital rectal examinations were normal.      Scattered small-mouthed diverticula were found in the sigmoid colon and       descending colon.      Three sessile polyps were found in the proximal rectum. The polyps were       4 to 5 mm in size. These polyps were removed with a cold snare.       Resection and retrieval were complete. Estimated blood loss was minimal.      From 20 cm from anal verge to 28 cm from the anal verge there was an       exophytic circumferential appearing neoplastic process significant       compromise of the lumen. I was able to advance the adult colonoscope       across it only after some difficulty.      Remainder the colonic mucosa appeared unremarkable.      Multiple biopsies of the sigmoid mass were taken for histologic study Impression:               -  Diverticulosis in the sigmoid colon and in the                            descending colon.                           - Three 4 to 5 mm polyps in the proximal rectum,                            removed with a cold snare. Resected and retrieved.                           Sigmoid mass -that is post biopsy Moderate Sedation:      Moderate (conscious) sedation was personally administered by an       anesthesia professional. The following  parameters were monitored: oxygen       saturation, heart rate, blood pressure, respiratory rate, EKG, adequacy       of pulmonary ventilation, and response to care. Total physician       intraservice time was 45 minutes. Recommendation:           - Patient has a contact number available for                            emergencies. The signs and symptoms of potential                            delayed complications were discussed with the                            patient. Return to normal activities tomorrow.                            Written discharge instructions were provided to the                            patient.                           - Advance diet as tolerated. Follow-up on                            pathology. Anticipate surgical consultation in the                            near future. Procedure Code(s):        --- Professional ---                           [phone], Colonoscopy, flexible; with removal of                            tumor(s), polyp(s), or other lesion(s) by snare                            technique Diagnosis Code(s):        ---  Professional ---                           K62.1, Rectal polyp                           K57.30, Diverticulosis of large intestine without                            perforation or abscess without bleeding                           R93.3, Abnormal findings on diagnostic imaging of                            other parts of digestive tract CPT copyright 2019 [employer]. All rights reserved. The codes documented in this report are preliminary and upon coder review may  be revised to meet current compliance requirements. [name]. Amberleigh Gerken, MD Norvel Richards, MD 11/12/2019 3:06:24 PM This report has been signed electronically. Number of Addenda: 0

## 2019-11-12 NOTE — Discharge Instructions (Signed)
Colon Polyps  Polyps are tissue growths inside the body. Polyps can grow in many places, including the large intestine (colon). A polyp may be a round bump or a mushroom-shaped growth. You could have one polyp or several. Most colon polyps are noncancerous (benign). However, some colon polyps can become cancerous over time. Finding and removing the polyps early can help prevent this. What are the causes? The exact cause of colon polyps is not known. What increases the risk? You are more likely to develop this condition if you:  Have a family history of colon cancer or colon polyps.  Are older than 55 or older than 45 if you are African American.  Have inflammatory bowel disease, such as ulcerative colitis or Crohn's disease.  Have certain hereditary conditions, such as: ? Familial adenomatous polyposis. ? Lynch syndrome. ? Turcot syndrome. ? Peutz-Jeghers syndrome.  Are overweight.  Smoke cigarettes.  Do not get enough exercise.  Drink too much alcohol.  Eat a diet that is high in fat and red meat and low in fiber.  Had childhood cancer that was treated with abdominal radiation. What are the signs or symptoms? Most polyps do not cause symptoms. If you have symptoms, they may include:  Blood coming from your rectum when having a bowel movement.  Blood in your stool. The stool may look dark red or black.  Abdominal pain.  A change in bowel habits, such as constipation or diarrhea. How is this diagnosed? This condition is diagnosed with a colonoscopy. This is a procedure in which a lighted, flexible scope is inserted into the anus and then passed into the colon to examine the area. Polyps are sometimes found when a colonoscopy is done as part of routine cancer screening tests. How is this treated? Treatment for this condition involves removing any polyps that are found. Most polyps can be removed during a colonoscopy. Those polyps will then be tested for cancer. Additional  treatment may be needed depending on the results of testing. Follow these instructions at home: Lifestyle  Maintain a healthy weight, or lose weight if recommended by your health care provider.  Exercise every day or as told by your health care provider.  Do not use any products that contain nicotine or tobacco, such as cigarettes and e-cigarettes. If you need help quitting, ask your health care provider.  If you drink alcohol, limit how much you have: ? 0-1 drink a day for women. ? 0-2 drinks a day for men.  Be aware of how much alcohol is in your drink. In the [place] one drink equals one 12 oz bottle of beer (355 mL), one 5 oz glass of wine (148 mL), or one 1 oz shot of hard liquor (44 mL). Eating and drinking   Eat foods that are high in fiber, such as fruits, vegetables, and whole grains.  Eat foods that are high in calcium and vitamin D, such as milk, cheese, yogurt, eggs, liver, fish, and broccoli.  Limit foods that are high in fat, such as fried foods and desserts.  Limit the amount of red meat and processed meat you eat, such as hot dogs, sausage, bacon, and lunch meats. General instructions  Keep all follow-up visits as told by your health care provider. This is important. ? This includes having regularly scheduled colonoscopies. ? Talk to your health care provider about when you need a colonoscopy. Contact a health care provider if:  You have new or worsening bleeding during a bowel movement.  You  have new or increased blood in your stool.  You have a change in bowel habits.  You lose weight for no known reason. Summary  Polyps are tissue growths inside the body. Polyps can grow in many places, including the colon.  Most colon polyps are noncancerous (benign), but some can become cancerous over time.  This condition is diagnosed with a colonoscopy.  Treatment for this condition involves removing any polyps that are found. Most polyps can be removed during a  colonoscopy. This information is not intended to replace advice given to you by your health care provider. Make sure you discuss any questions you have with your health care provider. Document Revised: 12/20/2017 Document Reviewed: 12/20/2017 Elsevier Patient Education  [place]. Diverticulosis  Diverticulosis is a condition that develops when small pouches (diverticula) form in the wall of the large intestine (colon). The colon is where water is absorbed and stool (feces) is formed. The pouches form when the inside layer of the colon pushes through weak spots in the outer layers of the colon. You may have a few pouches or many of them. The pouches usually do not cause problems unless they become inflamed or infected. When this happens, the condition is called diverticulitis. What are the causes? The cause of this condition is not known. What increases the risk? The following factors may make you more likely to develop this condition:  Being older than age 79. Your risk for this condition increases with age. Diverticulosis is rare among people younger than age 82. By age 43, many people have it.  Eating a low-fiber diet.  Having frequent constipation.  Being overweight.  Not getting enough exercise.  Smoking.  Taking over-the-counter pain medicines, like aspirin and ibuprofen.  Having a family history of diverticulosis. What are the signs or symptoms? In most people, there are no symptoms of this condition. If you do have symptoms, they may include:  Bloating.  Cramps in the abdomen.  Constipation or diarrhea.  Pain in the lower left side of the abdomen. How is this diagnosed? Because diverticulosis usually has no symptoms, it is most often diagnosed during an exam for other colon problems. The condition may be diagnosed by:  Using a flexible scope to examine the colon (colonoscopy).  Taking an X-ray of the colon after dye has been put into the colon (barium  enema).  Having a CT scan. How is this treated? You may not need treatment for this condition. Your health care provider may recommend treatment to prevent problems. You may need treatment if you have symptoms or if you previously had diverticulitis. Treatment may include:  Eating a high-fiber diet.  Taking a fiber supplement.  Taking a live bacteria supplement (probiotic).  Taking medicine to relax your colon. Follow these instructions at home: Medicines  Take over-the-counter and prescription medicines only as told by your health care provider.  If told by your health care provider, take a fiber supplement or probiotic. Constipation prevention Your condition may cause constipation. To prevent or treat constipation, you may need to:  Drink enough fluid to keep your urine pale yellow.  Take over-the-counter or prescription medicines.  Eat foods that are high in fiber, such as beans, whole grains, and fresh fruits and vegetables.  Limit foods that are high in fat and processed sugars, such as fried or sweet foods.  General instructions  Try not to strain when you have a bowel movement.  Keep all follow-up visits as told by your health care  provider. This is important. Contact a health care provider if you:  Have pain in your abdomen.  Have bloating.  Have cramps.  Have not had a bowel movement in 3 days. Get help right away if:  Your pain gets worse.  Your bloating becomes very bad.  You have a fever or chills, and your symptoms suddenly get worse.  You vomit.  You have bowel movements that are bloody or black.  You have bleeding from your rectum. Summary  Diverticulosis is a condition that develops when small pouches (diverticula) form in the wall of the large intestine (colon).  You may have a few pouches or many of them.  This condition is most often diagnosed during an exam for other colon problems.  Treatment may include increasing the fiber in  your diet, taking supplements, or taking medicines. This information is not intended to replace advice given to you by your health care provider. Make sure you discuss any questions you have with your health care provider. Document Revised: 04/03/2019 Document Reviewed: 04/03/2019 Elsevier Patient Education  [place].  Colonoscopy Discharge Instructions  Read the instructions outlined below and refer to this sheet in the next few weeks. These discharge instructions provide you with general information on caring for yourself after you leave the hospital. Your doctor may also give you specific instructions. While your treatment has been planned according to the most current medical practices available, unavoidable complications occasionally occur. If you have any problems or questions after discharge, call Dr. Gala Romney at [phone]. ACTIVITY  You may resume your regular activity, but move at a slower pace for the next 24 hours.   Take frequent rest periods for the next 24 hours.   Walking will help get rid of the air and reduce the bloated feeling in your belly (abdomen).   No driving for 24 hours (because of the medicine (anesthesia) used during the test).    Do not sign any important legal documents or operate any machinery for 24 hours (because of the anesthesia used during the test).  NUTRITION  Drink plenty of fluids.   You may resume your normal diet as instructed by your doctor.   Begin with a light meal and progress to your normal diet. Heavy or fried foods are harder to digest and may make you feel sick to your stomach (nauseated).   Avoid alcoholic beverages for 24 hours or as instructed.  MEDICATIONS  You may resume your normal medications unless your doctor tells you otherwise.  WHAT YOU CAN EXPECT TODAY  Some feelings of bloating in the abdomen.   Passage of more gas than usual.   Spotting of blood in your stool or on the toilet paper.  IF YOU HAD POLYPS  REMOVED DURING THE COLONOSCOPY:  No aspirin products for 7 days or as instructed.   No alcohol for 7 days or as instructed.   Eat a soft diet for the next 24 hours.  FINDING OUT THE RESULTS OF YOUR TEST Not all test results are available during your visit. If your test results are not back during the visit, make an appointment with your caregiver to find out the results. Do not assume everything is normal if you have not heard from your caregiver or the medical facility. It is important for you to follow up on all of your test results.  SEEK IMMEDIATE MEDICAL ATTENTION IF:  You have more than a spotting of blood in your stool.   Your belly is swollen (  abdominal distention).   You are nauseated or vomiting.   You have a temperature over 101.   You have abdominal pain or discomfort that is severe or gets worse throughout the day.  The risks, benefits, limitations, alternatives and imponderables have been reviewed with the patient. Potential for esophageal dilation, biopsy, etc. have also been reviewed.  Questions have been answered. All parties agreeable.  EGD Discharge instructions Please read the instructions outlined below and refer to this sheet in the next few weeks. These discharge instructions provide you with general information on caring for yourself after you leave the hospital. Your doctor may also give you specific instructions. While your treatment has been planned according to the most current medical practices available, unavoidable complications occasionally occur. If you have any problems or questions after discharge, please call your doctor. ACTIVITY  You may resume your regular activity but move at a slower pace for the next 24 hours.   Take frequent rest periods for the next 24 hours.   Walking will help expel (get rid of) the air and reduce the bloated feeling in your abdomen.   No driving for 24 hours (because of the anesthesia (medicine) used during the test).     You may shower.   Do not sign any important legal documents or operate any machinery for 24 hours (because of the anesthesia used during the test).  NUTRITION  Drink plenty of fluids.   You may resume your normal diet.   Begin with a light meal and progress to your normal diet.   Avoid alcoholic beverages for 24 hours or as instructed by your caregiver.  MEDICATIONS  You may resume your normal medications unless your caregiver tells you otherwise.  WHAT YOU CAN EXPECT TODAY  You may experience abdominal discomfort such as a feeling of fullness or "gas" pains.  FOLLOW-UP  Your doctor will discuss the results of your test with you.  SEEK IMMEDIATE MEDICAL ATTENTION IF ANY OF THE FOLLOWING OCCUR:  Excessive nausea (feeling sick to your stomach) and/or vomiting.   Severe abdominal pain and distention (swelling).   Trouble swallowing.   Temperature over 101 F (37.8 C).   Rectal bleeding or vomiting of blood.    Colon polyp and diverticulosis information provided  You have a tumor in your colon for which an operation will be needed to remove it.  Outpatient consultation with Dr. Blake Divine  Further recommendations to follow once pathology report is back for review.

## 2019-11-14 ENCOUNTER — Other Ambulatory Visit: Payer: Self-pay

## 2019-11-14 ENCOUNTER — Telehealth: Payer: Self-pay

## 2019-11-14 DIAGNOSIS — C189 Malignant neoplasm of colon, unspecified: Secondary | ICD-10-CM

## 2019-11-14 NOTE — Telephone Encounter (Signed)
Referral sent to Dr. Constance Haw (general surgery) via Epic per RMR.

## 2019-11-17 ENCOUNTER — Telehealth: Payer: Self-pay

## 2019-11-17 NOTE — Telephone Encounter (Signed)
Walter Allen at Dr. Constance Haw office called office, [facility]t. Walter Allen was unaware pt has colon cancer and will need notes so she can get authorization for him to see surgeon.  Spoke to Walter Allen, informed her pt has colon cancer. TCS report and pathology note faxed to her [phone].

## 2019-11-21 LAB — SURGICAL PATHOLOGY

## 2019-11-27 ENCOUNTER — Encounter: Payer: Self-pay | Admitting: General Surgery

## 2019-11-27 ENCOUNTER — Other Ambulatory Visit: Payer: Self-pay

## 2019-11-27 ENCOUNTER — Ambulatory Visit (INDEPENDENT_AMBULATORY_CARE_PROVIDER_SITE_OTHER): Admitting: General Surgery

## 2019-11-27 VITALS — BP 130/77 | HR 64 | Temp 98.1°F | Resp 12 | Ht 69.0 in | Wt 218.0 lb

## 2019-11-27 DIAGNOSIS — C187 Malignant neoplasm of sigmoid colon: Secondary | ICD-10-CM | POA: Diagnosis not present

## 2019-11-27 NOTE — Patient Instructions (Addendum)
Repeat imaging to stage colon cancer- CT abdomen, pelvis and chest to see if changes to the mass and metastatic disease is present given the timespan of 10 months from initial suspicion.  Lab work to assess liver function and CEA for cancer tumor marker.  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  Goal- Plan for Surgery in the next 3 weeks -Laparoscopic versus open partial colectomy  Will see patient back after Labs, CT, and Cardiology to get further information and bowel preparation.   Laparoscopic Colectomy Laparoscopic colectomy is surgery to remove part or all of the large intestine (colon). This procedure may be used to treat several conditions, including:  Inflammation and infection of the colon (diverticulitis).  Tumors or masses in the colon.  Inflammatory bowel disease, such as Crohn disease or ulcerative colitis. Colectomy is an option when symptoms cannot be controlled with medicines.  Bleeding from the colon that cannot be controlled by another method.  Blockage or obstruction of the colon. Tell a health care provider about:  Any allergies you have.  All medicines you are taking, including vitamins, herbs, eye drops, creams, and over-the-counter medicines.  Any problems you or family members have had with anesthetic medicines.  Any blood disorders you have.  Any surgeries you have had.  Any medical conditions you have. What are the risks? Generally, this is a safe procedure. However, problems may occur, including:  Infection.  Bleeding.  Allergic reactions to medicines or dyes.  Damage to other structures or organs.  Leaking from where the colon was sewn together.  Future blockage of the small intestines from scar tissue. Another surgery may be needed to repair this.  Needing to convert to an open procedure. Complications such as damage to other  organs or excessive bleeding may require the surgeon to convert from a laparoscopic procedure to an open procedure. This involves making a larger incision in the abdomen. Medicines  Ask your health care provider about: ? Changing or stopping your regular medicines. This is especially important if you are taking diabetes medicines or blood thinners. ? Taking medicines such as aspirin and ibuprofen. These medicines can thin your blood. Do not take these medicines before your procedure if your health care provider instructs you not to.  You may be given antibiotic medicine to clean out bacteria from your colon. Follow the directions carefully and take the medicine at the correct time. General instructions  You may be prescribed an oral bowel prep to clean out your colon in preparation for the surgery: ? Follow instructions from your health care provider about how to do this. ? Do not eat or drink anything else after you have started the bowel prep, unless your health care provider tells you it is safe to do so.  Do not use any products that contain nicotine or tobacco, such as cigarettes and e-cigarettes. If you need help quitting, ask your health care provider. What happens during the procedure?  To reduce your risk of infection: ? Your health care team will wash or sanitize their hands. ? Your skin will be washed with soap.  An IV tube will be inserted into one of your veins to deliver fluid and medication.  You will be given one of the following: ? A medicine to help you relax (sedative). ? A medicine to make you fall asleep (general anesthetic).  Small monitors will be connected to your body.  They will be used to check your heart, blood pressure, and oxygen level.  A breathing tube may be placed into your lungs during the procedure.  A thin, flexible tube (catheter) will be placed into your bladder to drain urine.  A tube may be placed through your nose and into your stomach to  drain stomach fluids (nasogastric tube, or NG tube).  Your abdomen will be filled with air so it expands. This gives the surgeon more room to operate and makes your organs easier to see.  Several small cuts (incisions) will be made in your abdomen.  A thin, lighted tube with a tiny camera on the end (laparoscope) will be put through one of the small incisions. The camera on the laparoscope will send a picture to a computer screen in the operating room. This will give the surgeon a good view inside your abdomen.  Hollow tubes will be put through the other small incisions in your abdomen. The tools that are needed for the procedure will be put through these tubes.  Clamps or staples will be put on both ends of the diseased part of the colon.  The part of the intestine between the clamps or staples will be removed.  If possible, the ends of the healthy colon that remain will be stitched (sutured) or stapled together to allow your body to pass waste (stool).  Sometimes, the remaining colon cannot be stitched back together. If this is the case, a colostomy will be needed. If you need a colostomy: ? An opening to the outside of your body (stoma) will be made through your abdomen. ? The end of your colon will be brought to the opening. It will be stitched to the skin. ? A bag will be attached to the opening. Stool will drain into this removable bag. ? The colostomy may be temporary or permanent.  The incisions from the colectomy will be closed with sutures or staples. The procedure may vary among health care providers and hospitals. What happens after the procedure?  Your blood pressure, heart rate, breathing rate, and blood oxygen level will be monitored until the medicines you were given have worn off.  You will receive fluids through an IV tube until your bowels start to work properly.  Once your bowels are working again, you will be given clear liquids first and then solid food as  tolerated.  You will be given medicines to control your pain and nausea, if needed.  Do not drive for 24 hours if you were given a sedative. This information is not intended to replace advice given to you by your health care provider. Make sure you discuss any questions you have with your health care provider. Document Revised: 08/17/2017 Document Reviewed: 06/05/2016 Elsevier Patient Education  2020 [employer].   Colorectal Cancer  Colorectal cancer is an abnormal growth of cells and tissue (tumor) in the colon or rectum, which are parts of the large intestine. The cancer can spread (metastasize) to other parts of the body. What are the causes? Most cases of colorectal cancer start as abnormal growths called polyps on the inner wall of the colon or rectum. Other times, abnormal changes to genes (genetic mutations) can cause cells to form cancer. What increases the risk? You are more likely to develop this condition if:  You are older than age 80.  You have multiple polyps in the colon or rectum.  You have diabetes.  You are African American.  You have a family history  of Lynch syndrome.  You have had cancer before.  You have certain hereditary conditions, such as: ? Familial adenomatous polyposis. ? Turcot syndrome. ? Peutz-Jeghers syndrome.  You eat a diet that is high in fat (especially animal fat) and low in fiber, fruits, and vegetables.  You have an inactive (sedentary) lifestyle.  You have an inflammatory bowel disease or Crohn's disease.  You smoke.  You drink alcohol excessively. What are the signs or symptoms? Early colorectal cancer often does not cause symptoms. As the cancer grows, symptoms may include:  Changes in bowel habits.  Feeling like the bowel does not empty completely after a bowel movement.  Stools that are narrower than usual.  Blood in the stool.  Diarrhea.  Constipation.  Anemia.  Discomfort, pain, bloating, fullness, or  cramps in the abdomen.  Frequent gas pain.  Unexplained weight loss.  Constant fatigue.  Nausea and vomiting. How is this diagnosed? This condition may be diagnosed with:  A medical history.  A physical exam.  Tests. These may include: ? A digital rectal exam. ? A stool test called a fecal occult blood test. ? Blood tests. ? A test in which a tissue sample is taken from the colon or rectum and examined under a microscope (biopsy). ? Imaging tests, such as:  X-rays.  A barium enema.  CT scans.  MRIs.  A sigmoidoscopy. This test is done to view the inside of the rectum.  A colonoscopy. This test is done to view the inside of the colon. During this test, small polyps can be removed or biopsies may be taken.  An endorectal ultrasound. This test checks how deep a tumor in the rectum has grown and whether the cancer has spread to lymph nodes or other nearby tissues. Your health care provider may order additional tests to find out whether the cancer has spread to other parts of the body (what stage it is). The stages of cancer include:  Stage 0. At this stage, the cancer is found only in the innermost lining of the colon or rectum.  Stage I. At this stage, the cancer has grown into the inner wall of the colon or rectum.  Stage II. At this stage, the cancer has gone more deeply into the wall of the colon or rectum or through the wall. It may have invaded nearby tissue.  Stage III. At this stage, the cancer has spread to nearby lymph nodes.  Stage IV. At this stage, the cancer has spread to other parts of the body, such as the liver or lungs. How is this treated? Treatment for this condition depends on the type and stage of the cancer. Treatment may include:  Surgery. In the early stages of the cancer, surgery may be done to remove polyps or small tumors from the colon. In later stages, surgery may be done to remove part of the colon.  Chemotherapy. This treatment uses  medicines to kill cancer cells.  Targeted therapy. This treatment targets specific gene mutations or proteins that the cancer expresses in order to kill tumor cells.  Radiation therapy. This treatment uses radiation to kill cancer cells or shrink tumors.  Radiofrequency ablation. This treatment uses radio waves to destroy the tumors that may have spread to other areas of the body, such as the liver. Follow these instructions at home:  Take over-the-counter and prescription medicines only as told by your health care provider.  Try to eat regular, healthy meals. Some of your treatments might affect your appetite.  If you are having problems eating or with your appetite, ask to meet with a food and nutrition specialist (dietitian).  Consider joining a support group. This may help you learn about your diagnosis and cope with the stress of having colorectal cancer.  If you are admitted to the hospital, inform your cancer care team.  Keep all follow-up visits as told by your health care provider. This is important. How is this prevented?  Colorectal cancer can be prevented with screening tests that find polyps so they can be removed before they develop into cancer.  All adults should have screening for colorectal cancer starting at age 7 and continuing until age 85. Your health care provider may recommend screening at age 74. People at increased risk should start screening at an earlier age. Where to find more information  American Cancer Society: https://www.cancer.[place]): https://www.cancer.gov Contact a health care provider if:  Your diarrhea or constipation does not go away.  You have blood in your stool.  Your bowel habits change.  You have increased pain in your abdomen.  You notice new fatigue or weakness.  You lose weight. Get help right away if:  You have increased bleeding from your rectum.  You have any uncontrollable or severe abdomen  (abdominal) symptoms. Summary  Colorectal cancer is an abnormal growth of cells and tissue (tumor) in the colon or rectum.  Common risk factors for this condition include having a relative with colon cancer, being older in age, having an inflammatory bowel disease, and being African American.  This condition may be diagnosed with tests, such as a colonoscopy and biopsy.  Treatment depends on the type and stage of the cancer. Commonly, treatment includes surgery to remove the tumor along with chemotherapy or targeted therapy.  Keep all follow-up visits as told by your health care provider. This is important. This information is not intended to replace advice given to you by your health care provider. Make sure you discuss any questions you have with your health care provider. Document Revised: 10/25/2017 Document Reviewed: 10/06/2016 Elsevier Patient Education  [place].

## 2019-11-27 NOTE — Progress Notes (Signed)
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 Gala Allen   Chief Complaint    New Patient (Initial Visit)      Walter Allen is a 59 y.o. male.  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 He received antibiotics and his pain improved with recommendations for close follow up with colonoscopy or CT scan. He was scheduled for GI follow up and colonoscopy but this was delayed due to Walter Allen testing and scheduling with the correctional facility.  He describes having intermittent abdominal pain with BMs and Bms that fluctuate from constipation to diarrhea. They can fluctuate in the same day from hard and solid to liquid. He says he has not had any further rectal bleeding since May.  He underwent a colonoscopy with Dr. Gala Allen in February and was found to have a circumferential mass in the sigmoid colon.    Precovid the patient reports that he was able to walk 20 mile a day. He says that he had a stent placed a [facility] in [place] in 2016 when he had a STEMI.  He had been on plavix but this was stopped when he had the bleeding in May.  He says he has leg swelling with R> L leg swelling since 2017. He has listed in his facility paper work severe systolic heart failure after STEMI.  We have no ECHO or cardiology documentation in our system.  He denies any chest pain or shortness of breath recently, but has not been able to walk since COVID due to restrictions on being outside.   Patient says he did not know about having colon cancer and that he is not given much information from the correctional facility regarding his own health. He is unsure what medications he is on and he says he is not really allowed to ask questions.  He says he has  not seen a cardiologist in years and that it is not up to him.  He is unsure of why he is taking spironolactone.   He says in the last year he has lost maybe 10 lbs.  He says that he does not know much about his family history but he did have a grandfather with cancer but unsure the type.   Colonoscopy 10/2019 Impression:  Diverticulosis in the sigmoid colon and in the descending colon. Three 4 to 5 mm polyps in the proximal rectum, removed with a cold snare. Resected and retrieved. Sigmoid mass -that is post biopsy  Pathology: Dr. Gala Allen commented that the biopsies listed rectal biopsies were of hyperplastic polyps and not cancer. The A and B were both from the sigmoid biopsy per his report and documentation.  See the note from Dr. Gala Allen at the bottom of the pathology report.  A. RECTUM, BIOPSY  - Invasive adenocarcinoma, see comment.  - Hyperplastic polyp (x2 fragments).   B. COLON, SIGMOID, BIOPSY:  - Invasive adenocarcinoma, see comment.  - Hyperplastic polyp.   Dr. Gala Allen writes "Discussed pathology with Dr. Tresa Allen, the pathologist. Patient has a bulky sigmoid adenocarcinoma. Tissue in "B" a "carry over" from tumor biopsy. Patient had innocent appearing hyperplastic polyps in the proximal rectum. No tumor. Patient needs a sigmoid resection. Yesterday, I explained to the patient he had a colon tumor which is  going to require an operation for removal. Referral being made to Dr. Curlene Allen."   Past Medical History:  Diagnosis Date  . Abscess of mouth   . Arthritis   . Cellulitis of mouth   . Chalazion   . Chronic rhinitis   . Corns and callosities   . Coronary artery disease   . Cough   . Functional dyspepsia   . GERD (gastroesophageal reflux disease)   . Heart disease    severe left ventricular systolic dysfunction after anterior MI  . Hemoptysis   . Hyperlipidemia   . Hypertension   . Joint pain   . Presbyopia   . STEMI (ST elevation myocardial infarction)  ([place]) 2016  . Tinea unguium   . Toe pain   . Vitamin D deficiency     Past Surgical History:  Procedure Laterality Date  . BIOPSY  11/12/2019   Procedure: BIOPSY;  Surgeon: Walter Dolin, MD;  [facility]: [facility];  Service: Endoscopy;;  . COLONOSCOPY N/A 11/12/2019   Procedure: COLONOSCOPY;  Surgeon: Walter Dolin, MD;  [facility]: [facility];  Service: Endoscopy;  Laterality: N/A;  2:00pm  . CORONARY STENT PLACEMENT  2016  . ESOPHAGOGASTRODUODENOSCOPY N/A 11/12/2019   Procedure: ESOPHAGOGASTRODUODENOSCOPY (EGD);  Surgeon: Walter Dolin, MD;  [facility]: [facility];  Service: Endoscopy;  Laterality: N/A;  . POLYPECTOMY  11/12/2019   Procedure: POLYPECTOMY;  Surgeon: Walter Dolin, MD;  [facility]: [facility];  Service: Endoscopy;;    Family History  Problem Relation Age of Onset  . Colon cancer Neg Hx   . Colon polyps Neg Hx     Social History   Tobacco Use  . Smoking status: Former Smoker    Packs/day: 1.00    Years: 40.00    Pack years: 40.00    Types: Cigarettes    Quit date: 01/22/2006    Years since quitting: 13.8  . Smokeless tobacco: Never Used  Substance Use Topics  . Alcohol use: Never  . Drug use: Never    Medications: I have reviewed the patient's current medications.  Allergies as of 11/27/2019      Reactions   Kale Nausea And Vomiting   Collard Howard Sink Virosa]       Medication List       Accurate as of November 27, 2019  1:31 PM. If you have any questions, ask your nurse or doctor.        acetaminophen 650 MG CR tablet Commonly known as: TYLENOL Take 1,300 mg by mouth 3 (three) times daily.   aspirin EC 81 MG tablet Take 1 tablet (81 mg total) by mouth daily with breakfast.   atorvastatin 80 MG tablet Commonly known as: LIPITOR Take 80 mg by mouth daily.   bisacodyl 5 MG EC tablet Commonly known as: DULCOLAX Take 10 mg by mouth See admin instructions. Take 2 tablets (10 mg) by mouth at 2pm the day before colonoscopy    docusate sodium 100 MG capsule Commonly known as: COLACE Take 100 mg by mouth 2 (two) times daily.   ferrous sulfate 325 (65 FE) MG tablet 1 tablet twice a day with meals What changed:   how much to take  how to take this  when to take this  additional instructions   furosemide 20 MG tablet Commonly known as: LASIX Take 20 mg by mouth daily.   isosorbide mononitrate 30 MG 24 hr tablet Commonly known as: IMDUR Take 30 mg by mouth daily.  metoprolol succinate 25 MG 24 hr tablet Commonly known as: TOPROL-XL Take 25 mg by mouth daily.   nitroGLYCERIN 0.4 MG SL tablet Commonly known as: NITROSTAT Place 0.4 mg under the tongue every 5 (five) minutes x 3 doses as needed for chest pain.   omeprazole 20 MG capsule Commonly known as: PRILOSEC Take 1 capsule (20 mg total) by mouth 2 (two) times daily before a meal.   polyethylene glycol-electrolytes 420 g solution Commonly known as: NuLYTELY As directed What changed:   how much to take  how to take this  when to take this  additional instructions   spironolactone 25 MG tablet Commonly known as: ALDACTONE Take 25 mg by mouth daily.   TGT Saline Laxative Enem Place 1 Dose rectally once.        ROS:  A comprehensive review of systems was negative except for: Gastrointestinal: positive for abdominal pain, constipation, diarrhea and reflux symptoms Genitourinary: positive for dysuria, frequency and retention Musculoskeletal: positive for joint pain Neurological: positive for headaches  Blood pressure 130/77, pulse 64, temperature 98.1 F (36.7 C), temperature source Oral, resp. rate 12, height 5\' 9"  (1.753 m), weight 218 lb (98.9 kg), SpO2 97 %. Physical Exam Vitals reviewed.  Constitutional:      Appearance: Normal appearance.  HENT:     Head: Normocephalic and atraumatic.     Nose: Nose normal.     Mouth/Throat:     Mouth: Mucous membranes are moist.  Eyes:     Extraocular Movements: Extraocular  movements intact.     Pupils: Pupils are equal, round, and reactive to light.  Cardiovascular:     Rate and Rhythm: Normal rate and regular rhythm.  Pulmonary:     Effort: Pulmonary effort is normal.     Breath sounds: Normal breath sounds.  Abdominal:     General: There is no distension.     Palpations: Abdomen is soft.     Tenderness: There is abdominal tenderness in the suprapubic area.  Musculoskeletal:        General: No swelling. Normal range of motion.     Cervical back: Normal range of motion. No rigidity.  Skin:    General: Skin is warm and dry.  Neurological:     General: No focal deficit present.     Mental Status: He is alert and oriented to person, place, and time.  Psychiatric:        Mood and Affect: Mood normal.        Behavior: Behavior normal.        Thought Content: Thought content normal.        Judgment: Judgment normal.     Results: CT 01/2019- personally reviewed- area of thickened colon, very minimal diverticula in the area CLINICAL DATA:  Lower abdominal pain and nausea beginning today.  EXAM: CT ABDOMEN AND PELVIS WITH CONTRAST  TECHNIQUE: Multidetector CT imaging of the abdomen and pelvis was performed using the standard protocol following bolus administration of intravenous contrast.  CONTRAST:  140mL OMNIPAQUE IOHEXOL 300 MG/ML  SOLN  COMPARISON:  None.  FINDINGS: Lower Chest: No acute findings.  Hepatobiliary: No hepatic masses identified. Gallbladder is unremarkable.  Pancreas:  No mass or inflammatory changes.  Spleen: Within normal limits in size and appearance.  Adrenals/Urinary Tract: No masses identified. No evidence of hydronephrosis.  Stomach/Bowel: A short-segment area of wall thickening is seen in the mid sigmoid colon with minimal hazy opacity in the adjacent pericolonic fat. This could represent sigmoid diverticulitis or  colon carcinoma. No evidence of abscess or bowel obstruction.  Vascular/Lymphatic: No  pathologically enlarged lymph nodes. No abdominal aortic aneurysm. Aortic atherosclerosis.  Reproductive:  No mass or other significant abnormality.  Other:  None.  Musculoskeletal:  No suspicious bone lesions identified.  IMPRESSION: 1. Short-segment wall thickening in mid sigmoid colon, with minimal pericolonic hazy opacity. Differential diagnosis includes sigmoid diverticulitis and colon carcinoma. Consider further evaluation with colonoscopy or short-term follow-up CT in 3-4 weeks. 2. No evidence of abscess or bowel obstruction.  Aortic Atherosclerosis (ICD10-I70.0).   Electronically Signed   By: Earle Gell M.D.   On: 02/15/2019 01:52  Assessment & Plan:  Walter Allen is a 60 y.o. male with a sigmoid colon cancer that has been there since May 2020 as seen on the CT scan. This is now about 10 months since that time and the are is likely much larger and could have developed into metastatic disease.  He is having fluctuation in BMs and could be consistent with narrowing from the circumferential mass.  He has a history of cardiac issues and STEMI in 2016 and is not followed by cardiology.  There is documentation of severe systolic dysfunction after STEMI in his documentation from the facility, and I have no ECHO or idea of what this means.    Everything we do for the patient has to be okayed through the correctional system. We have contacted the correctional system people to start this process as the patient needs further work up and ultimately surgery.   Repeat imaging to stage colon cancer- CT abdomen, pelvis and chest to see if changes to the mass and if metastatic disease is present given the timespan of 10 months from initial suspicion.  Lab work to assess CBC, CMP, CEA  Referral to Cardiology for Risk Stratification given history of MI and facility documentation of severe systolic dysfunction and no ECHO in our system.   I would hope that this could be accomplished in  the next 3 weeks.  Discussed -Laparoscopic versus open partial colectomy and risk of bleeding, infection, anastomotic leak, need for colostomy, fact that we could have significant metastatic disease.   Will see patient back after Labs, CT, and Cardiology to get further information and bowel preparation.   All questions were answered to the satisfaction of the patient.   Virl Cagey 11/27/2019, 1:31 PM

## 2020-01-27 ENCOUNTER — Ambulatory Visit (INDEPENDENT_AMBULATORY_CARE_PROVIDER_SITE_OTHER): Admitting: General Surgery

## 2020-01-27 ENCOUNTER — Other Ambulatory Visit: Payer: Self-pay

## 2020-01-27 ENCOUNTER — Encounter: Payer: Self-pay | Admitting: General Surgery

## 2020-01-27 VITALS — BP 145/76 | HR 69 | Temp 97.5°F | Resp 12 | Ht 69.0 in | Wt 226.0 lb

## 2020-01-27 DIAGNOSIS — C187 Malignant neoplasm of sigmoid colon: Secondary | ICD-10-CM | POA: Diagnosis not present

## 2020-01-27 MED ORDER — DULCOLAX 5 MG PO TBEC: 5.0000 mg | DELAYED_RELEASE_TABLET | ORAL | 0 refills | Status: DC

## 2020-01-27 MED ORDER — POLYETHYLENE GLYCOL 3350 17 GM/SCOOP PO POWD: 1.0000 | ORAL | 0 refills | Status: DC

## 2020-01-27 MED ORDER — METRONIDAZOLE 500 MG PO TABS: 500.0000 mg | ORAL_TABLET | ORAL | 0 refills | Status: DC

## 2020-01-27 MED ORDER — NEOMYCIN SULFATE 500 MG PO TABS: 1000.0000 mg | ORAL_TABLET | ORAL | 0 refills | Status: DC

## 2020-01-27 NOTE — Patient Instructions (Addendum)
ECHO report needed and final clearance from Cardiology. Cardiology requested ECHO and Lipid pain, CBC.  You will likely be in the hospital for 7 days. No lifting > 10 lbs, excessive bending, squatting, pushing or pulling for 8 weeks after surgery.  The Operating Room Will need Preoperative lab work and COVID testing prior to surgery.   Bowel Preparation: Get from pharmacy/ store: Miralax bottle [phone]).  Gatorade 64 oz (not red). Dulcolax tablets.   The Day Prior to Surgery: Take 4 ducolax tablets at 7am with water. Drink plenty of clear liquids all day to avoid dehydration, no solid food.    Mix the bottle of Miralax and 64 oz of Gatorade and drink this mixture starting at 10am. Drink it gradually over the next few hours, 8 ounces every 15-30 minutes until it is gone. Finish this by 2pm.  Take 2 neomycin 500mg  tablets and 2 metronidazole 500mg  tablets at 2 pm. Take 2 neomycin 500mg  tablets and 2 metronidazole 500mg  tablets at 3pm. Take 2 neomycin 500mg  tablets and 2 metronidazole 500mg  tablets at 10pm.    Do not eat or drink anything after midnight the night before your surgery.  Do not eat or drink anything that morning, and take medications as instructed by the hospital staff on your preoperative visit.    Open Partial Colectomy An open colectomy is surgery to remove part or all of the large intestine (colon). This procedure may be used to treat several conditions, including:  Inflammation and infection of the colon (diverticulitis).  Tumors or masses in the colon.  Inflammatory bowel disease, such as Crohn disease or ulcerative colitis.  Bleeding from the colon.  Blockage or obstruction of the colon. Tell a health care provider about:  Any allergies you have.  All medicines you are taking, including vitamins, herbs, eye drops, creams, and over-the-counter medicines.  Any problems you or family members have had with anesthetic medicines.  Any blood disorders you  have.  Any surgeries you have had.  Any medical conditions you have.  Whether you are pregnant or may be pregnant.  Whether you smoke or use tobacco products. These can affect your body's reaction to anesthesia. What are the risks? Generally, this is a safe procedure. However, problems may occur, including:  Infection.  Bleeding.  Allergic reactions to medicines.  Damage to other structures or organs.  Pneumonia.  The incision opening up.  Tissues from inside the abdomen bulging through the incision (hernia).  Reopening of the colon where it was stitched or stapled together.  A blood clot forming in a vein and traveling to the lungs.  Future blockage of the small intestine from scar tissue. Bowel prep In some cases, you may be prescribed an oral bowel prep to clean out your colon. If so:  Take it as told by your health care provider. Starting the day before your procedure, you may need to drink a large amount of medicated liquid. The liquid will cause you to have multiple loose stools until your stool is almost clear or light green.  Follow instructions from your health care provider about eating and drinking restrictions during bowel prep. Medicines  Ask your health care provider about: ? Changing or stopping your regular medicines or vitamins. This is especially important if you are taking diabetes medicines, blood thinners, or vitamin E. ? Taking medicines such as aspirin and ibuprofen. These medicines can thin your blood. Do not take these medicines before your procedure if your health care provider instructs you not  to.  If you were prescribed an antibiotic medicine, take it as told by your health care provider. General instructions  Bring loose-fitting, comfortable clothing and slip-on shoes that you can put on without bending over.  Make sure to see your health care provider for any tests that you need before the procedure, such as: ? Blood tests. ? A test to  check the heart's rhythm (electrocardiogram, ECG). ? A CT scan of your abdomen. ? Urine tests. ? Colonoscopy.  Plan to have someone take you home from the hospital or clinic.  Arrange for someone to help you with your activities during your recovery. What happens during the procedure?  To reduce your risk of infection: ? Your health care team will wash or sanitize their hands. ? Your skin will be washed with soap. ? Hair may be removed from the surgical area.  An IV tube will be inserted into one of your veins. The tube will be used to give you medicines and fluids.  You will be given a medicine to make you fall asleep (general anesthetic). You may also be given a medicine to help you relax (sedative).  Small monitors will be connected to your body. They will be used to check your heart, blood pressure, and oxygen level.  A breathing tube may be placed into your lungs during the procedure.  A thin, flexible tube (catheter) will be placed into your bladder to drain urine.  A tube may be inserted through your nose and into your stomach (nasogastric tube, or NG tube). The tube is used to remove stomach fluids after surgery until the intestines start working again.  An incision will be made in your abdomen.  Clamps or staples will be put on your colon.  The part of the colon between the clamps or staples will be removed.  The ends of the colon that remain will be stitched or stapled together.  The incision in your abdomen will be closed with stitches (sutures) or staples.  The incision will be covered with a bandage (dressing).  A small opening (stoma) may be created in your lower abdomen. A removable, external pouch (ostomy pouch) will be attached to the stoma. This pouch will collect stool outside of your body. Stool passes through the stoma and into the pouch instead of through your anus. The procedure may vary among health care providers and hospitals. What happens after the  procedure?  Your blood pressure, heart rate, breathing rate, and blood oxygen level will be monitored until the medicines you were given have worn off.  You may continue to receive fluids and medicines through an IV tube.  You will start on a clear liquid diet and gradually go back to a normal diet.  Do not drive until your health care provider approves.  You may have some pain in your abdomen. You will be given pain medicine to control the pain.  You will be encouraged to do the following: ? Do breathing exercises to prevent pneumonia. ? Get up and start walking within a day after surgery. You should try to get up 5-6 times a day. This information is not intended to replace advice given to you by your health care provider. Make sure you discuss any questions you have with your health care provider. Document Revised: 02/28/2019 Document Reviewed: 06/05/2016 Elsevier Patient Education  [place].

## 2020-01-27 NOTE — Progress Notes (Signed)
[facility] History and Physical  Reason for Referral:  Sigmoid Colon Cancer  Referring Physician:  Dr. Gala Romney, MD   Chief Complaint    Follow-up      Walter Allen is a 59 y.o. male.  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 His colonoscopy had been delayed for a variety of reasons, but ultimately it did confirm cancer.  He saw me about 2 months ago, which is a big delay in getting back to me to get him scheduled for surgery. I had wanted him to get CT chest, abdomen and pelvis to look for any signs of metastatic disease given the delay in the diagnosis, and also wanted him to be seen by Cardiology. He has been seen by Cardiology 4/22 and they asked for lab work and an ECHO. They have not yet risk stratified him due to not having these documents.  He has had a CT c/a/p that demonstrates no obvious metastatic disease, some small 1-2 mm pulmonary nodules favored to be mucoid impaction, and some haziness around the sigmoid colon and prominent lymph nodes which are likely related to his cancer spreading.   The patient says he is otherwise doing about the same. He denies any chest pain or SOB. He says that he has some pain with BMs in the lower abdomen and has some hesitancy with urination after BMs.  He is frustrated by the delays and wants to proceed with surgery as soon as possible.   Past Medical History:  Diagnosis Date  . Abscess of mouth   . Arthritis   . Cellulitis of mouth   . Chalazion   . Chronic rhinitis   . Corns and callosities   . Coronary artery disease   . Cough   . Functional dyspepsia   . GERD (gastroesophageal reflux disease)   . Heart disease    severe left ventricular systolic dysfunction after anterior MI  . Hemoptysis   . Hyperlipidemia   . Hypertension   . Joint pain   . Presbyopia   . STEMI (ST elevation  myocardial infarction) ([place]) 2016  . Tinea unguium   . Toe pain   . Vitamin D deficiency     Past Surgical History:  Procedure Laterality Date  . BIOPSY  11/12/2019   Procedure: BIOPSY;  Surgeon: Daneil Dolin, MD;  [facility];  Service: Endoscopy;;  . COLONOSCOPY N/A 11/12/2019   Procedure: COLONOSCOPY;  Surgeon: Daneil Dolin, MD;  [facility];  Service: Endoscopy;  Laterality: N/A;  2:00pm  . CORONARY STENT PLACEMENT  2016  . ESOPHAGOGASTRODUODENOSCOPY N/A 11/12/2019   Procedure: ESOPHAGOGASTRODUODENOSCOPY (EGD);  Surgeon: Daneil Dolin, MD;  [facility];  Service: Endoscopy;  Laterality: N/A;  . POLYPECTOMY  11/12/2019   Procedure: POLYPECTOMY;  Surgeon: Daneil Dolin, MD;  [facility];  Service: Endoscopy;;    Family History  Problem Relation Age of Onset  . Colon cancer Neg Hx   . Colon polyps Neg Hx   Grandfather with cancer of unknown type   Social History   Tobacco Use  . Smoking status: Former Smoker    Packs/day: 1.00    Years: 40.00    Pack years: 40.00    Types: Cigarettes    Quit date: 01/22/2006    Years since quitting: 14.0  .  Smokeless tobacco: Never Used  Substance Use Topics  . Alcohol use: Never  . Drug use: Never    Medications: I have reviewed the patient's current medications. Allergies as of 01/27/2020      Reactions   Kale Nausea And Vomiting   Collard [place] Sink Virosa]       Medication List       Accurate as of Jan 27, 2020 11:12 AM. If you have any questions, ask your nurse or doctor.        acetaminophen 650 MG CR tablet Commonly known as: TYLENOL Take 1,300 mg by mouth 3 (three) times daily.   aspirin EC 81 MG tablet Take 1 tablet (81 mg total) by mouth daily with breakfast.   atorvastatin 80 MG tablet Commonly known as: LIPITOR Take 80 mg by mouth daily.   bisacodyl 5 MG EC tablet Commonly known as: DULCOLAX Take 10 mg by mouth See admin instructions. Take 2  tablets (10 mg) by mouth at 2pm the day before colonoscopy   docusate sodium 100 MG capsule Commonly known as: COLACE Take 100 mg by mouth 2 (two) times daily.   ferrous sulfate 325 (65 FE) MG tablet 1 tablet twice a day with meals What changed:   how much to take  how to take this  when to take this  additional instructions   furosemide 20 MG tablet Commonly known as: LASIX Take 20 mg by mouth daily.   isosorbide mononitrate 30 MG 24 hr tablet Commonly known as: IMDUR Take 30 mg by mouth daily.   metoprolol succinate 25 MG 24 hr tablet Commonly known as: TOPROL-XL Take 25 mg by mouth daily.   nitroGLYCERIN 0.4 MG SL tablet Commonly known as: NITROSTAT Place 0.4 mg under the tongue every 5 (five) minutes x 3 doses as needed for chest pain.   omeprazole 20 MG capsule Commonly known as: PRILOSEC Take 1 capsule (20 mg total) by mouth 2 (two) times daily before a meal.   polyethylene glycol-electrolytes 420 g solution Commonly known as: NuLYTELY As directed What changed:   how much to take  how to take this  when to take this  additional instructions   spironolactone 25 MG tablet Commonly known as: ALDACTONE Take 25 mg by mouth daily.   TGT Saline Laxative Enem Place 1 Dose rectally once.        ROS:  A comprehensive review of systems was negative except for: Gastrointestinal: positive for abdominal pain  Blood pressure (!) 145/76, pulse 69, temperature (!) 97.5 F (36.4 C), temperature source Oral, resp. rate 12, height 5\' 9"  (1.753 m), weight 226 lb (102.5 kg), SpO2 94 %. Physical Exam Vitals reviewed.  Constitutional:      Appearance: He is normal weight.  HENT:     Head: Normocephalic and atraumatic.     Nose: Nose normal.     Mouth/Throat:     Mouth: Mucous membranes are moist.  Eyes:     Extraocular Movements: Extraocular movements intact.     Pupils: Pupils are equal, round, and reactive to light.  Cardiovascular:     Rate and Rhythm:  Normal rate and regular rhythm.  Pulmonary:     Effort: Pulmonary effort is normal.     Breath sounds: Normal breath sounds.  Abdominal:     General: There is no distension.     Palpations: Abdomen is soft.     Tenderness: There is abdominal tenderness in the suprapubic area.  Musculoskeletal:  General: No swelling. Normal range of motion.     Cervical back: Normal range of motion. No rigidity.  Skin:    General: Skin is warm and dry.  Neurological:     General: No focal deficit present.     Mental Status: He is alert and oriented to person, place, and time.  Psychiatric:        Mood and Affect: Mood normal.        Behavior: Behavior normal.        Thought Content: Thought content normal.        Judgment: Judgment normal.     Results:                     Assessment & Plan:  Hewlett Barrueta is a 59 y.o. male with a sigmoid colon cancer that needs surgery to remove. He has some evidence of spread to the lymph nodes based on the radiology report. We are still waiting on cardiology risk stratification, and cardiology needs an ECHO, lipid pain and CBC per their documentation.   -Open partial colectomy. Discussed the risk of bleeding, infection, anastomotic leak, injury to other organs, and need for further treatment for the cancer.  ECHO report needed and final clearance from Cardiology. Cardiology requested ECHO and Lipid pain, CBC.  You will likely be in the hospital for 7 days. No lifting > 10 lbs, excessive bending, squatting, pushing or pulling for 8 weeks after surgery.  The Operating Room Will need Preoperative lab work and COVID testing prior to surgery.   Bowel Preparation: Get from pharmacy/ store: Miralax bottle [phone]).  Gatorade 64 oz (not red). Dulcolax tablets.   The Day Prior to Surgery: Take 4 ducolax tablets at 7am with water. Drink plenty of clear liquids all day to avoid dehydration, no solid food.    Mix the bottle of Miralax and  64 oz of Gatorade and drink this mixture starting at 10am. Drink it gradually over the next few hours, 8 ounces every 15-30 minutes until it is gone. Finish this by 2pm.  Take 2 neomycin 500mg  tablets and 2 metronidazole 500mg  tablets at 2 pm. Take 2 neomycin 500mg  tablets and 2 metronidazole 500mg  tablets at 3pm. Take 2 neomycin 500mg  tablets and 2 metronidazole 500mg  tablets at 10pm.    Do not eat or drink anything after midnight the night before your surgery.  Do not eat or drink anything that morning, and take medications as instructed by the hospital staff on your preoperative visit.   All questions were answered to the satisfaction of the patient. D[facility]ed [facility] and they are arranging the ECHO.   Virl Cagey 01/27/2020, 11:12 AM

## 2020-03-16 ENCOUNTER — Ambulatory Visit (INDEPENDENT_AMBULATORY_CARE_PROVIDER_SITE_OTHER): Admitting: General Surgery

## 2020-03-16 ENCOUNTER — Encounter: Payer: Self-pay | Admitting: General Surgery

## 2020-03-16 ENCOUNTER — Other Ambulatory Visit: Payer: Self-pay

## 2020-03-16 VITALS — BP 120/77 | HR 66 | Temp 97.3°F | Resp 14 | Ht 69.0 in | Wt 227.0 lb

## 2020-03-16 DIAGNOSIS — C187 Malignant neoplasm of sigmoid colon: Secondary | ICD-10-CM | POA: Diagnosis not present

## 2020-03-16 MED ORDER — METRONIDAZOLE 500 MG PO TABS: 1000.0000 mg | ORAL_TABLET | ORAL | 0 refills | Status: DC

## 2020-03-16 MED ORDER — NEOMYCIN SULFATE 500 MG PO TABS: 1000.0000 mg | ORAL_TABLET | ORAL | 0 refills | Status: DC

## 2020-03-16 MED ORDER — POLYETHYLENE GLYCOL 3350 17 GM/SCOOP PO POWD: 1.0000 | Freq: Once | ORAL | 0 refills | Status: DC

## 2020-03-16 MED ORDER — DULCOLAX 5 MG PO TBEC: 20.0000 mg | DELAYED_RELEASE_TABLET | Freq: Once | ORAL | 0 refills | Status: AC

## 2020-03-16 MED ORDER — DULCOLAX 5 MG PO TBEC: 20.0000 mg | DELAYED_RELEASE_TABLET | Freq: Once | ORAL | 0 refills | Status: DC

## 2020-03-16 MED ORDER — POLYETHYLENE GLYCOL 3350 17 GM/SCOOP PO POWD: 1.0000 | Freq: Once | ORAL | 0 refills | Status: AC

## 2020-03-16 NOTE — Progress Notes (Signed)
[facility] History and Physical  Reason for Referral: Sigmoid Colon Cancer  Referring Physician:  Dr. Gala Romney, MD   Chief Complaint    Discuss surgery      Walter Allen is a 59 y.o. male.  HPI:  Walter Allen is a 59 yo who is returning to clinic again after finishing his preoperative workup for colon cancer.  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 His colonoscopy was delayed due to COVID and a variety of other reasons.  He saw me earlier this year, and I had a CT a/p done to look for metastatic disease given the delay in his diagnosis. I also asked for cardiology for risk stratification due to history of STEMI in 2016.  This took several weeks/ month and he returned in May and Cardiology had asked for an ECHO that had not been completed.   He had the CT a/p that demonstrated no obvious metastatic disease but prominent lymph nodes and and mucoid impaction in the chest.  He has now had the ECHO that demonstrates a EF 55-60% and given his ability to exercise for 45 minutes a day they felt that he did not need a stress test.  He had previously been exercising to [place] but this has been limited with less outdoor activity since COVID. They felt him to be a low to moderate risk for a cardia complication. [facility] CT and E[facility] and not our system.   His surgery has been delayed now with workup several times, and he is frustrated, understandably.   Past Medical History:  Diagnosis Date  . Abscess of mouth   . Arthritis   . Cellulitis of mouth   . Chalazion   . Chronic rhinitis   . Corns and callosities   . Coronary artery disease   . Cough   . Functional dyspepsia   . GERD (gastroesophageal reflux disease)   . Heart disease    severe left ventricular systolic dysfunction after anterior MI  . Hemoptysis   . Hyperlipidemia   .  Hypertension   . Joint pain   . Presbyopia   . STEMI (ST elevation myocardial infarction) ([place]) 2016  . Tinea unguium   . Toe pain   . Vitamin D deficiency     Past Surgical History:  Procedure Laterality Date  . BIOPSY  11/12/2019   Procedure: BIOPSY;  Surgeon: Daneil Dolin, MD;  [facility];  Service: Endoscopy;;  . COLONOSCOPY N/A 11/12/2019   Procedure: COLONOSCOPY;  Surgeon: Daneil Dolin, MD;  [facility];  Service: Endoscopy;  Laterality: N/A;  2:00pm  . CORONARY STENT PLACEMENT  2016  . ESOPHAGOGASTRODUODENOSCOPY N/A 11/12/2019   Procedure: ESOPHAGOGASTRODUODENOSCOPY (EGD);  Surgeon: Daneil Dolin, MD;  [facility];  Service: Endoscopy;  Laterality: N/A;  . POLYPECTOMY  11/12/2019   Procedure: POLYPECTOMY;  Surgeon: Daneil Dolin, MD;  [facility];  Service: Endoscopy;;    Family History  Problem Relation Age of Onset  . Colon cancer Neg Hx   . Colon polyps Neg Hx     Social History   Tobacco Use  . Smoking status: Former Smoker    Packs/day: 1.00    Years: 40.00    Pack years: 40.00    Types: Cigarettes    Quit date: 01/22/2006  Years since quitting: 14.1  . Smokeless tobacco: Never Used  Vaping Use  . Vaping Use: Never used  Substance Use Topics  . Alcohol use: Never  . Drug use: Never    Medications: I have reviewed the patient's current medications. Allergies as of 03/16/2020      Reactions   Kale Nausea And Vomiting   Collard [place] Sink Virosa]    Other Nausea And Vomiting   Mustard greens, collards, kale, turnip greens, plack eyed peas      Medication List       Accurate as of March 16, 2020 10:30 AM. If you have any questions, ask your nurse or doctor.        acetaminophen 650 MG CR tablet Commonly known as: TYLENOL Take 1,300 mg by mouth 3 (three) times daily.   atorvastatin 80 MG tablet Commonly known as: LIPITOR Take 80 mg by mouth daily.   bisacodyl 5 MG EC tablet Commonly  known as: DULCOLAX Take 10 mg by mouth See admin instructions. Take 2 tablets (10 mg) by mouth at 2pm the day before colonoscopy   Dulcolax 5 MG EC tablet Generic drug: bisacodyl Take 1 tablet (5 mg total) by mouth as directed. Take 4 ducolax tablets at 7am the day prior to surgery with water.   docusate sodium 100 MG capsule Commonly known as: COLACE Take 100 mg by mouth 2 (two) times daily.   ferrous sulfate 325 (65 FE) MG tablet 1 tablet twice a day with meals What changed:   how much to take  how to take this  when to take this  additional instructions   furosemide 20 MG tablet Commonly known as: LASIX Take 20 mg by mouth daily.   isosorbide mononitrate 30 MG 24 hr tablet Commonly known as: IMDUR Take 30 mg by mouth daily.   metoprolol succinate 25 MG 24 hr tablet Commonly known as: TOPROL-XL Take 25 mg by mouth daily.   metroNIDAZOLE 500 MG tablet Commonly known as: Flagyl Take 1 tablet (500 mg total) by mouth as directed. Take 2 flagyl 500mg  tablets at 2 pm, 3pm and 10pm the day prior to surgery.   neomycin 500 MG tablet Commonly known as: MYCIFRADIN Take 2 tablets (1,000 mg total) by mouth as directed.   nitroGLYCERIN 0.4 MG SL tablet Commonly known as: NITROSTAT Place 0.4 mg under the tongue every 5 (five) minutes x 3 doses as needed for chest pain.   omeprazole 20 MG capsule Commonly known as: PRILOSEC Take 1 capsule (20 mg total) by mouth 2 (two) times daily before a meal.   polyethylene glycol powder 17 GM/SCOOP powder Commonly known as: GLYCOLAX/MIRALAX Take 255 g by mouth as directed. The day prior to surgery mix the bottle of Miralax and 64 oz of Gatorade and drink this mixture starting at 10am. Drink it gradually over the next few hours, 8 ounces every 15-30 minutes until it is gone. Finish this by 2pm   polyethylene glycol-electrolytes 420 g solution Commonly known as: NuLYTELY As directed What changed:   how much to take  how to take  this  when to take this  additional instructions   spironolactone 25 MG tablet Commonly known as: ALDACTONE Take 25 mg by mouth daily.   TGT Saline Laxative Enem Place 1 Dose rectally once.        ROS:  A comprehensive review of systems was negative except for: Gastrointestinal: positive for alternating constipation and diarrhea, lower abdominal pain  Blood pressure 120/77, pulse  66, temperature (!) 97.3 F (36.3 C), temperature source Other (Comment), resp. rate 14, height 5\' 9"  (1.753 m), weight 227 lb (103 kg), SpO2 96 %. Physical Exam Vitals reviewed.  Constitutional:      Appearance: He is normal weight.  HENT:     Head: Normocephalic.     Nose: Nose normal.     Mouth/Throat:     Mouth: Mucous membranes are moist.  Eyes:     Extraocular Movements: Extraocular movements intact.     Pupils: Pupils are equal, round, and reactive to light.  Cardiovascular:     Rate and Rhythm: Normal rate and regular rhythm.  Pulmonary:     Effort: Pulmonary effort is normal.     Breath sounds: Normal breath sounds.  Abdominal:     General: There is no distension.     Palpations: Abdomen is soft.     Tenderness: There is no abdominal tenderness.  Musculoskeletal:        General: No swelling. Normal range of motion.     Cervical back: Normal range of motion. No rigidity.  Skin:    General: Skin is warm and dry.  Neurological:     General: No focal deficit present.     Mental Status: He is alert and oriented to person, place, and time.  Psychiatric:        Mood and Affect: Mood normal.        Behavior: Behavior normal.        Thought Content: Thought content normal.        Judgment: Judgment normal.     Results:                         Assessment & Plan:  Walter Allen is a 59 y.o. male with sigmoid colon cancer that needs resection. We discussed that given the evidence that it is in the lymph nodes and ensuring a good oncologic resection that we  will proceed with a open partial colectomy. Discussed that cardiology felt him to be a low to moderate risk for a cardiac complication. Discussed risk of bleeding, infection, anastomotic leak, injury to other organs like the ureter, and need for further cancer treatment.   Preoperative COVID testing needed, he has received vaccines  Bowel Preparation the Day Prior to Surgery:  Buy from the Store: Miralax bottle (288g).  Gatorade 64 oz (not red). Dulcolax tablets.   The Day Prior to Surgery: Take 4 ducolax tablets at 7am with water. Drink plenty of clear liquids all day to avoid dehydration, no solid food.    Mix the bottle of Miralax and 64 oz of Gatorade and drink this mixture starting at 10am. Drink it gradually over the next few hours, 8 ounces every 15-30 minutes until it is gone. Finish this by 2pm.  Take 2 neomycin 500mg  tablets and 2 metronidazole 500mg  tablets at 2 pm. Take 2 neomycin 500mg  tablets and 2 metronidazole 500mg  tablets at 3pm. Take 2 neomycin 500mg  tablets and 2 metronidazole 500mg  tablets at 10pm.    Do not eat or drink anything after midnight the night before your surgery.  Do not eat or drink anything that morning, and take medications as instructed by the hospital staff on your preoperative visit.   Plan for Surgery 03/31/2020 Potentially in the hospital 5-7 days No heavy lifting > 10 lbs, excessive bending, pushing, pulling, or squatting for 8 weeks after surgery.  Will need post operative pain control with narcotics.  Prescriptions for the bowel preparation printed out and sent with patient.   All questions were answered to the satisfaction of the patient.     Virl Cagey 03/16/2020, 10:30 AM

## 2020-03-16 NOTE — Patient Instructions (Addendum)
Colon Preparation: Buy from the Store: Miralax bottle (255g).  Gatorade 64 oz (not red). Dulcolax tablets.   The Day Prior to Surgery: Take 4 ducolax tablets at 7am with water. Drink plenty of clear liquids all day to avoid dehydration, no solid food.    Mix the bottle of Miralax and 64 oz of Gatorade and drink this mixture starting at 10am. Drink it gradually over the next few hours, 8 ounces every 15-30 minutes until it is gone. Finish this by 2pm.  Take 2 neomycin 500mg  tablets and 2 metronidazole 500mg  tablets at 2 pm. Take 2 neomycin 500mg  tablets and 2 metronidazole 500mg  tablets at 3pm. Take 2 neomycin 500mg  tablets and 2 metronidazole 500mg  tablets at 10pm.    Do not eat or drink anything after midnight the night before your surgery.  Do not eat or drink anything that morning, and take medications as instructed by the hospital staff on your preoperative visit.    Open Colectomy, Care After This sheet gives you information about how to care for yourself after your procedure. Your health care provider may also give you more specific instructions. If you have problems or questions, contact your health care provider. What can I expect after the procedure? After the procedure, it is common to have:  Pain in your abdomen, especially along your incision.  Tiredness. Your energy level will return to normal over the next several weeks.  Constipation.  Nausea.  Difficulty urinating. Follow these instructions at home: Activity  You may be able to return to most of your normal activities within 1-2 weeks, such as working, walking up stairs, and sexual activity.  Avoid activities that require a lot of energy for 4-6 weeks after surgery, such as running, climbing, and lifting heavy objects. Ask your health care provider what activities are safe for you.  Take rest breaks during the day as needed.  Do not drive for 1-2 weeks or until your health care provider says that it is  safe.  Do not drive or use heavy machinery while taking prescription pain medicines.  Do not lift anything that is heavier than 10 lb (4.3 kg) until your health care provider says that it is safe. Incision care   Follow instructions from your health care provider about how to take care of your incision. Make sure you: ? Wash your hands with soap and water before you change your bandage (dressing). If soap and water are not available, use hand sanitizer. ? Change your dressing as told by your health care provider. ? Leave stitches (sutures) or staples in place. These skin closures may need to stay in place for 2 weeks or longer.  Avoid wearing tight clothing around your incision.  Protect your incision area from the sun.  Check your incision area every day for signs of infection. Check for: ? More redness, swelling, or pain. ? More fluid or blood. ? Warmth. ? Pus or a bad smell. General instructions  Do not take baths, swim, or use a hot tub until your health care provider approves. Ask your health care provider when you may shower.  Take over-the-counter and prescription medicines, including stool softeners, only as told by your health care provider.  Eat a low-fat and low-fiber diet for the first 4 weeks after surgery.  Keep all follow-up visits as told by your health care provider. This is important. Contact a health care provider if:  You have more redness, swelling, or pain around your incision.  You have more  fluid or blood coming from your incision.  Your incision feels warm to the touch.  You have pus or a bad smell coming from your incision.  You have a fever or chills.  You do not have a bowel movement 2-3 days after surgery.  You cannot eat or drink for 24 hours or more.  You have persistent nausea and vomiting.  You have abdominal pain that gets worse and does not get better with medicine. Get help right away if:  You have chest pain.  You have  shortness of breath.  You have pain or swelling in your legs.  Your incision breaks open after your sutures or staples have been removed.  You have bleeding from the rectum. This information is not intended to replace advice given to you by your health care provider. Make sure you discuss any questions you have with your health care provider. Document Revised: 08/17/2017 Document Reviewed: 06/05/2016 Elsevier Patient Education  2020 [employer].

## 2020-03-21 NOTE — H&P (Addendum)
[facility] History and Physical  Reason for Referral: Sigmoid Colon Cancer  Referring Physician:  Dr. Gala Romney, MD   Chief Complaint    Discuss surgery      Walter Allen is a 59 y.o. male.  HPI:  Mr. Wyndham is a 59 yo who is returning to clinic again after finishing his preoperative workup for colon cancer.  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 His colonoscopy was delayed due to COVID and a variety of other reasons.  He saw me earlier this year, and I had a CT a/p done to look for metastatic disease given the delay in his diagnosis. I also asked for cardiology for risk stratification due to history of STEMI in 2016.  This took several weeks/ month and he returned in May and Cardiology had asked for an ECHO that had not been completed.   He had the CT a/p that demonstrated no obvious metastatic disease but prominent lymph nodes and and mucoid impaction in the chest.  He has now had the ECHO that demonstrates a EF 55-60% and given his ability to exercise for 45 minutes a day they felt that he did not need a stress test.  He had previously been exercising to [place] but this has been limited with less outdoor activity since COVID. They felt him to be a low to moderate risk for a cardia complication. [facility] CT and E[facility] and not [facility] system.   His surgery has been delayed now with workup several times, and he is frustrated, understandably.   Past Medical History:  Diagnosis Date  . Abscess of mouth   . Arthritis   . Cellulitis of mouth   . Chalazion   . Chronic rhinitis   . Corns and callosities   . Coronary artery disease   . Cough   . Functional dyspepsia   . GERD (gastroesophageal reflux disease)   . Heart disease    severe left ventricular systolic dysfunction after anterior MI  . Hemoptysis   . Hyperlipidemia   .  Hypertension   . Joint pain   . Presbyopia   . STEMI (ST elevation myocardial infarction) ([place]) 2016  . Tinea unguium   . Toe pain   . Vitamin D deficiency     Past Surgical History:  Procedure Laterality Date  . BIOPSY  11/12/2019   Procedure: BIOPSY;  Surgeon: Daneil Dolin, MD;  Location: [facility];  Service: Endoscopy;;  . COLONOSCOPY N/A 11/12/2019   Procedure: COLONOSCOPY;  Surgeon: Daneil Dolin, MD;  Location: [facility];  Service: Endoscopy;  Laterality: N/A;  2:00pm  . CORONARY STENT PLACEMENT  2016  . ESOPHAGOGASTRODUODENOSCOPY N/A 11/12/2019   Procedure: ESOPHAGOGASTRODUODENOSCOPY (EGD);  Surgeon: Daneil Dolin, MD;  Location: [facility];  Service: Endoscopy;  Laterality: N/A;  . POLYPECTOMY  11/12/2019   Procedure: POLYPECTOMY;  Surgeon: Daneil Dolin, MD;  Location: [facility];  Service: Endoscopy;;    Family History  Problem Relation Age of Onset  . Colon cancer Neg Hx   . Colon polyps Neg Hx     Social History   Tobacco Use  . Smoking status: Former Smoker    Packs/day: 1.00    Years: 40.00    Pack years: 40.00    Types: Cigarettes    Quit date: 01/22/2006  Years since quitting: 14.1  . Smokeless tobacco: Never Used  Vaping Use  . Vaping Use: Never used  Substance Use Topics  . Alcohol use: Never  . Drug use: Never    Medications: I have reviewed the patient's current medications. Allergies as of 03/16/2020      Reactions   Kale Nausea And Vomiting   Collard Shickley Sink Virosa]    Other Nausea And Vomiting   Mustard greens, collards, kale, turnip greens, plack eyed peas      Medication List       Accurate as of March 16, 2020 10:30 AM. If you have any questions, ask your nurse or doctor.        acetaminophen 650 MG CR tablet Commonly known as: TYLENOL Take 1,300 mg by mouth 3 (three) times daily.   atorvastatin 80 MG tablet Commonly known as: LIPITOR Take 80 mg by mouth daily.   bisacodyl 5 MG EC tablet Commonly  known as: DULCOLAX Take 10 mg by mouth See admin instructions. Take 2 tablets (10 mg) by mouth at 2pm the day before colonoscopy   Dulcolax 5 MG EC tablet Generic drug: bisacodyl Take 1 tablet (5 mg total) by mouth as directed. Take 4 ducolax tablets at 7am the day prior to surgery with water.   docusate sodium 100 MG capsule Commonly known as: COLACE Take 100 mg by mouth 2 (two) times daily.   ferrous sulfate 325 (65 FE) MG tablet 1 tablet twice a day with meals What changed:   how much to take  how to take this  when to take this  additional instructions   furosemide 20 MG tablet Commonly known as: LASIX Take 20 mg by mouth daily.   isosorbide mononitrate 30 MG 24 hr tablet Commonly known as: IMDUR Take 30 mg by mouth daily.   metoprolol succinate 25 MG 24 hr tablet Commonly known as: TOPROL-XL Take 25 mg by mouth daily.   metroNIDAZOLE 500 MG tablet Commonly known as: Flagyl Take 1 tablet (500 mg total) by mouth as directed. Take 2 flagyl 500mg  tablets at 2 pm, 3pm and 10pm the day prior to surgery.   neomycin 500 MG tablet Commonly known as: MYCIFRADIN Take 2 tablets (1,000 mg total) by mouth as directed.   nitroGLYCERIN 0.4 MG SL tablet Commonly known as: NITROSTAT Place 0.4 mg under the tongue every 5 (five) minutes x 3 doses as needed for chest pain.   omeprazole 20 MG capsule Commonly known as: PRILOSEC Take 1 capsule (20 mg total) by mouth 2 (two) times daily before a meal.   polyethylene glycol powder 17 GM/SCOOP powder Commonly known as: GLYCOLAX/MIRALAX Take 255 g by mouth as directed. The day prior to surgery mix the bottle of Miralax and 64 oz of Gatorade and drink this mixture starting at 10am. Drink it gradually over the next few hours, 8 ounces every 15-30 minutes until it is gone. Finish this by 2pm   polyethylene glycol-electrolytes 420 g solution Commonly known as: NuLYTELY As directed What changed:   how much to take  how to take  this  when to take this  additional instructions   spironolactone 25 MG tablet Commonly known as: ALDACTONE Take 25 mg by mouth daily.   TGT Saline Laxative Enem Place 1 Dose rectally once.        ROS:  A comprehensive review of systems was negative except for: Gastrointestinal: positive for alternating constipation and diarrhea, lower abdominal pain  Blood pressure 120/77, pulse  66, temperature (!) 97.3 F (36.3 C), temperature source Other (Comment), resp. rate 14, height 5\' 9"  (1.753 m), weight 227 lb (103 kg), SpO2 96 %. Physical Exam Vitals reviewed.  Constitutional:      Appearance: He is normal weight.  HENT:     Head: Normocephalic.     Nose: Nose normal.     Mouth/Throat:     Mouth: Mucous membranes are moist.  Eyes:     Extraocular Movements: Extraocular movements intact.     Pupils: Pupils are equal, round, and reactive to light.  Cardiovascular:     Rate and Rhythm: Normal rate and regular rhythm.  Pulmonary:     Effort: Pulmonary effort is normal.     Breath sounds: Normal breath sounds.  Abdominal:     General: There is no distension.     Palpations: Abdomen is soft.     Tenderness: There is no abdominal tenderness.  Musculoskeletal:        General: No swelling. Normal range of motion.     Cervical back: Normal range of motion. No rigidity.  Skin:    General: Skin is warm and dry.  Neurological:     General: No focal deficit present.     Mental Status: He is alert and oriented to person, place, and time.  Psychiatric:        Mood and Affect: Mood normal.        Behavior: Behavior normal.        Thought Content: Thought content normal.        Judgment: Judgment normal.     Results:                         Assessment & Plan:  Lyonel Morejon is a 59 y.o. male with sigmoid colon cancer that needs resection. We discussed that given the evidence that it is in the lymph nodes and ensuring a good oncologic resection that we  will proceed with a open partial colectomy. Discussed that cardiology felt him to be a low to moderate risk for a cardiac complication. Discussed risk of bleeding, infection, anastomotic leak, injury to other organs like the ureter, and need for further cancer treatment.   Preoperative COVID testing needed, he has received vaccines  Bowel Preparation the Day Prior to Surgery:  Buy from the Store: Miralax bottle (288g).  Gatorade 64 oz (not red). Dulcolax tablets.   The Day Prior to Surgery: Take 4 ducolax tablets at 7am with water. Drink plenty of clear liquids all day to avoid dehydration, no solid food.    Mix the bottle of Miralax and 64 oz of Gatorade and drink this mixture starting at 10am. Drink it gradually over the next few hours, 8 ounces every 15-30 minutes until it is gone. Finish this by 2pm.  Take 2 neomycin 500mg  tablets and 2 metronidazole 500mg  tablets at 2 pm. Take 2 neomycin 500mg  tablets and 2 metronidazole 500mg  tablets at 3pm. Take 2 neomycin 500mg  tablets and 2 metronidazole 500mg  tablets at 10pm.    Do not eat or drink anything after midnight the night before your surgery.  Do not eat or drink anything that morning, and take medications as instructed by the hospital staff on your preoperative visit.   Plan for Surgery 03/31/2020 Potentially in the hospital 5-7 days No heavy lifting > 10 lbs, excessive bending, pushing, pulling, or squatting for 8 weeks after surgery.  Will need post operative pain control with narcotics.  Prescriptions for the bowel preparation printed out and sent with patient.  Discussed consent for blood.   All questions were answered to the satisfaction of the patient.     Virl Cagey 03/16/2020, 10:30 AM

## 2020-03-25 NOTE — Patient Instructions (Signed)
Walter Allen  03/25/2020     @PREFPERIOPPHARMACY @   Your procedure is scheduled on  03/31/2020 .  Report to [facility] at  0800  A.M.  Call this number if you have problems the morning of surgery:  [phone]   Remember:  Follow the diet and prep instructions (copy enclosed) given to you by Dr Constance Haw. Drink your carb loading drink at 0600 03/31/2020. DO NOT sip this, drink rapidly.                        Take these medicines the morning of surgery with A SIP OF WATER  Isosorbide, metoprolol, prilosec.    Do not wear jewelry, make-up or nail polish.  Do not wear lotions, powders, or perfumes. Please wear deodorant and brush your teeth  Do not shave 48 hours prior to surgery.  Men may shave face and neck.  Do not bring valuables to the hospital.  [facility] is not responsible for any belongings or valuables.  Contacts, dentures or bridgework may not be worn into surgery.  Leave your suitcase in the car.  After surgery it may be brought to your room.  For patients admitted to the hospital, discharge time will be determined by your treatment team.  Patients discharged the day of surgery will not be allowed to drive home.   Name and phone number of your driver:   Facility transportation. Special instructions:  DO NOT smoke the morning of your procedure.  Please read over the following fact sheets that you were given. Pain Booklet, Coughing and Deep Breathing, Blood Transfusion Information, Lab Information, Surgical Site Infection Prevention, Anesthesia Post-op Instructions and Care and Recovery After Surgery       Open Colectomy, Care After This sheet gives you information about how to care for yourself after your procedure. Your health care provider may also give you more specific instructions. If you have problems or questions, contact your health care provider. What can I expect after the procedure? After the procedure, it is common to have:  Pain in your  abdomen, especially along your incision.  Tiredness. Your energy level will return to normal over the next several weeks.  Constipation.  Nausea.  Difficulty urinating. Follow these instructions at home: Activity  You may be able to return to most of your normal activities within 1-2 weeks, such as working, walking up stairs, and sexual activity.  Avoid activities that require a lot of energy for 4-6 weeks after surgery, such as running, climbing, and lifting heavy objects. Ask your health care provider what activities are safe for you.  Take rest breaks during the day as needed.  Do not drive for 1-2 weeks or until your health care provider says that it is safe.  Do not drive or use heavy machinery while taking prescription pain medicines.  Do not lift anything that is heavier than 10 lb (4.3 kg) until your health care provider says that it is safe. Incision care   Follow instructions from your health care provider about how to take care of your incision. Make sure you: ? Wash your hands with soap and water before you change your bandage (dressing). If soap and water are not available, use hand sanitizer. ? Change your dressing as told by your health care provider. ? Leave stitches (sutures) or staples in place. These skin closures may need to stay in place for 2 weeks or longer.  Avoid wearing tight clothing around your incision.  Protect your incision area from the sun.  Check your incision area every day for signs of infection. Check for: ? More redness, swelling, or pain. ? More fluid or blood. ? Warmth. ? Pus or a bad smell. General instructions  Do not take baths, swim, or use a hot tub until your health care provider approves. Ask your health care provider when you may shower.  Take over-the-counter and prescription medicines, including stool softeners, only as told by your health care provider.  Eat a low-fat and low-fiber diet for the first 4 weeks after  surgery.  Keep all follow-up visits as told by your health care provider. This is important. Contact a health care provider if:  You have more redness, swelling, or pain around your incision.  You have more fluid or blood coming from your incision.  Your incision feels warm to the touch.  You have pus or a bad smell coming from your incision.  You have a fever or chills.  You do not have a bowel movement 2-3 days after surgery.  You cannot eat or drink for 24 hours or more.  You have persistent nausea and vomiting.  You have abdominal pain that gets worse and does not get better with medicine. Get help right away if:  You have chest pain.  You have shortness of breath.  You have pain or swelling in your legs.  Your incision breaks open after your sutures or staples have been removed.  You have bleeding from the rectum. This information is not intended to replace advice given to you by your health care provider. Make sure you discuss any questions you have with your health care provider. Document Revised: 08/17/2017 Document Reviewed: 06/05/2016 Elsevier Patient Education  2020 [place] Anesthesia, Adult, Care After This sheet gives you information about how to care for yourself after your procedure. Your health care provider may also give you more specific instructions. If you have problems or questions, contact your health care provider. What can I expect after the procedure? After the procedure, the following side effects are common:  Pain or discomfort at the IV site.  Nausea.  Vomiting.  Sore throat.  Trouble concentrating.  Feeling cold or chills.  Weak or tired.  Sleepiness and fatigue.  Soreness and body aches. These side effects can affect parts of the body that were not involved in surgery. Follow these instructions at home:  For at least 24 hours after the procedure:  Have a responsible adult stay with you. It is important to  have someone help care for you until you are awake and alert.  Rest as needed.  Do not: ? Participate in activities in which you could fall or become injured. ? Drive. ? Use heavy machinery. ? Drink alcohol. ? Take sleeping pills or medicines that cause drowsiness. ? Make important decisions or sign legal documents. ? Take care of children on your own. Eating and drinking  Follow any instructions from your health care provider about eating or drinking restrictions.  When you feel hungry, start by eating small amounts of foods that are soft and easy to digest (bland), such as toast. Gradually return to your regular diet.  Drink enough fluid to keep your urine pale yellow.  If you vomit, rehydrate by drinking water, juice, or clear broth. General instructions  If you have sleep apnea, surgery and certain medicines can increase your risk for breathing problems. Follow instructions from  your health care provider about wearing your sleep device: ? Anytime you are sleeping, including during daytime naps. ? While taking prescription pain medicines, sleeping medicines, or medicines that make you drowsy.  Return to your normal activities as told by your health care provider. Ask your health care provider what activities are safe for you.  Take over-the-counter and prescription medicines only as told by your health care provider.  If you smoke, do not smoke without supervision.  Keep all follow-up visits as told by your health care provider. This is important. Contact a health care provider if:  You have nausea or vomiting that does not get better with medicine.  You cannot eat or drink without vomiting.  You have pain that does not get better with medicine.  You are unable to pass urine.  You develop a skin rash.  You have a fever.  You have redness around your IV site that gets worse. Get help right away if:  You have difficulty breathing.  You have chest pain.  You  have blood in your urine or stool, or you vomit blood. Summary  After the procedure, it is common to have a sore throat or nausea. It is also common to feel tired.  Have a responsible adult stay with you for the first 24 hours after general anesthesia. It is important to have someone help care for you until you are awake and alert.  When you feel hungry, start by eating small amounts of foods that are soft and easy to digest (bland), such as toast. Gradually return to your regular diet.  Drink enough fluid to keep your urine pale yellow.  Return to your normal activities as told by your health care provider. Ask your health care provider what activities are safe for you. This information is not intended to replace advice given to you by your health care provider. Make sure you discuss any questions you have with your health care provider. Document Revised: 09/07/2017 Document Reviewed: 04/20/2017 Elsevier Patient Education  [place]. How to Use Chlorhexidine for Bathing Chlorhexidine gluconate (CHG) is a germ-killing (antiseptic) solution that is used to clean the skin. It can get rid of the bacteria that normally live on the skin and can keep them away for about 24 hours. To clean your skin with CHG, you may be given:  A CHG solution to use in the shower or as part of a sponge bath.  A prepackaged cloth that contains CHG. Cleaning your skin with CHG may help lower the risk for infection:  While you are staying in the intensive care unit of the hospital.  If you have a vascular access, such as a central line, to provide short-term or long-term access to your veins.  If you have a catheter to drain urine from your bladder.  If you are on a ventilator. A ventilator is a machine that helps you breathe by moving air in and out of your lungs.  After surgery. What are the risks? Risks of using CHG include:  A skin reaction.  Hearing loss, if CHG gets in your ears.  Eye  injury, if CHG gets in your eyes and is not rinsed out.  The CHG product catching fire. Make sure that you avoid smoking and flames after applying CHG to your skin. Do not use CHG:  If you have a chlorhexidine allergy or have previously reacted to chlorhexidine.  On babies younger than 80 months of age. How to use CHG solution  Use CHG only as told by your health care provider, and follow the instructions on the label.  Use the full amount of CHG as directed. Usually, this is one bottle. During a shower Follow these steps when using CHG solution during a shower (unless your health care provider gives you different instructions): 1. Start the shower. 2. Use your normal soap and shampoo to wash your face and hair. 3. Turn off the shower or move out of the shower stream. 4. Pour the CHG onto a clean washcloth. Do not use any type of brush or rough-edged sponge. 5. Starting at your neck, lather your body down to your toes. Make sure you follow these instructions: ? If you will be having surgery, pay special attention to the part of your body where you will be having surgery. Scrub this area for at least 1 minute. ? Do not use CHG on your head or face. If the solution gets into your ears or eyes, rinse them well with water. ? Avoid your genital area. ? Avoid any areas of skin that have broken skin, cuts, or scrapes. ? Scrub your back and under your arms. Make sure to wash skin folds. 6. Let the lather sit on your skin for 1-2 minutes or as long as told by your health care provider. 7. Thoroughly rinse your entire body in the shower. Make sure that all body creases and crevices are rinsed well. 8. Dry off with a clean towel. Do not put any substances on your body afterward--such as powder, lotion, or perfume--unless you are told to do so by your health care provider. Only use lotions that are recommended by the manufacturer. 9. Put on clean clothes or pajamas. 10. If it is the night before  your surgery, sleep in clean sheets.  During a sponge bath Follow these steps when using CHG solution during a sponge bath (unless your health care provider gives you different instructions): 1. Use your normal soap and shampoo to wash your face and hair. 2. Pour the CHG onto a clean washcloth. 3. Starting at your neck, lather your body down to your toes. Make sure you follow these instructions: ? If you will be having surgery, pay special attention to the part of your body where you will be having surgery. Scrub this area for at least 1 minute. ? Do not use CHG on your head or face. If the solution gets into your ears or eyes, rinse them well with water. ? Avoid your genital area. ? Avoid any areas of skin that have broken skin, cuts, or scrapes. ? Scrub your back and under your arms. Make sure to wash skin folds. 4. Let the lather sit on your skin for 1-2 minutes or as long as told by your health care provider. 5. Using a different clean, wet washcloth, thoroughly rinse your entire body. Make sure that all body creases and crevices are rinsed well. 6. Dry off with a clean towel. Do not put any substances on your body afterward--such as powder, lotion, or perfume--unless you are told to do so by your health care provider. Only use lotions that are recommended by the manufacturer. 7. Put on clean clothes or pajamas. 8. If it is the night before your surgery, sleep in clean sheets. How to use CHG prepackaged cloths  Only use CHG cloths as told by your health care provider, and follow the instructions on the label.  Use the CHG cloth on clean, dry skin.  Do not use  the CHG cloth on your head or face unless your health care provider tells you to.  When washing with the CHG cloth: ? Avoid your genital area. ? Avoid any areas of skin that have broken skin, cuts, or scrapes. Before surgery Follow these steps when using a CHG cloth to clean before surgery (unless your health care provider gives  you different instructions): 1. Using the CHG cloth, vigorously scrub the part of your body where you will be having surgery. Scrub using a back-and-forth motion for 3 minutes. The area on your body should be completely wet with CHG when you are done scrubbing. 2. Do not rinse. Discard the cloth and let the area air-dry. Do not put any substances on the area afterward, such as powder, lotion, or perfume. 3. Put on clean clothes or pajamas. 4. If it is the night before your surgery, sleep in clean sheets.  For general bathing Follow these steps when using CHG cloths for general bathing (unless your health care provider gives you different instructions). 1. Use a separate CHG cloth for each area of your body. Make sure you wash between any folds of skin and between your fingers and toes. Wash your body in the following order, switching to a new cloth after each step: ? The front of your neck, shoulders, and chest. ? Both of your arms, under your arms, and your hands. ? Your stomach and groin area, avoiding the genitals. ? Your right leg and foot. ? Your left leg and foot. ? The back of your neck, your back, and your buttocks. 2. Do not rinse. Discard the cloth and let the area air-dry. Do not put any substances on your body afterward--such as powder, lotion, or perfume--unless you are told to do so by your health care provider. Only use lotions that are recommended by the manufacturer. 3. Put on clean clothes or pajamas. Contact a health care provider if:  Your skin gets irritated after scrubbing.  You have questions about using your solution or cloth. Get help right away if:  Your eyes become very red or swollen.  Your eyes itch badly.  Your skin itches badly and is red or swollen.  Your hearing changes.  You have trouble seeing.  You have swelling or tingling in your mouth or throat.  You have trouble breathing.  You swallow any chlorhexidine. Summary  Chlorhexidine gluconate  (CHG) is a germ-killing (antiseptic) solution that is used to clean the skin. Cleaning your skin with CHG may help to lower your risk for infection.  You may be given CHG to use for bathing. It may be in a bottle or in a prepackaged cloth to use on your skin. Carefully follow your health care provider's instructions and the instructions on the product label.  Do not use CHG if you have a chlorhexidine allergy.  Contact your health care provider if your skin gets irritated after scrubbing. This information is not intended to replace advice given to you by your health care provider. Make sure you discuss any questions you have with your health care provider. Document Revised: 11/21/2018 Document Reviewed: 08/02/2017 Elsevier Patient Education  [place].

## 2020-03-29 ENCOUNTER — Other Ambulatory Visit: Payer: Self-pay

## 2020-03-29 ENCOUNTER — Encounter (HOSPITAL_COMMUNITY)
Admission: RE | Admit: 2020-03-29 | Discharge: 2020-03-29 | Disposition: A | Discharge status: Home / Self Care | Source: Ambulatory Visit | Attending: General Surgery | Admitting: General Surgery

## 2020-03-29 ENCOUNTER — Encounter (HOSPITAL_COMMUNITY): Payer: Self-pay

## 2020-03-29 DIAGNOSIS — Z01818 Encounter for other preprocedural examination: Secondary | ICD-10-CM | POA: Insufficient documentation

## 2020-03-29 LAB — COMPREHENSIVE METABOLIC PANEL
ALT: 22 U/L (ref 0–44)
AST: 20 U/L (ref 15–41)
Albumin: 3.6 g/dL (ref 3.5–5.0)
Alkaline Phosphatase: 97 U/L (ref 38–126)
Anion gap: 9 (ref 5–15)
BUN: 16 mg/dL (ref 8–23)
CO2: 24 mmol/L (ref 22–32)
Calcium: 9.2 mg/dL (ref 8.9–10.3)
Chloride: 104 mmol/L (ref 98–111)
Creatinine, Ser: 0.91 mg/dL (ref 0.61–1.24)
GFR calc Af Amer: >60 mL/min (ref >60)
GFR calc non Af Amer: >60 mL/min (ref >60)
Glucose, Bld: 178 mg/dL — ABNORMAL HIGH (ref 70–99)
Potassium: 3.7 mmol/L (ref 3.5–5.1)
Sodium: 137 mmol/L (ref 135–145)
Total Bilirubin: 0.4 mg/dL (ref 0.3–1.2)
Total Protein: 6.5 g/dL (ref 6.5–8.1)

## 2020-03-29 LAB — CBC WITH DIFFERENTIAL/PLATELET
Abs Immature Granulocytes: 0.04 10*3/uL (ref 0.00–0.07)
Basophils Absolute: 0.1 10*3/uL (ref 0.0–0.1)
Basophils Relative: 1 %
Eosinophils Absolute: 0.1 10*3/uL (ref 0.0–0.5)
Eosinophils Relative: 2 %
HCT: 31.9 % — ABNORMAL LOW (ref 39.0–52.0)
Hemoglobin: 8.6 g/dL — ABNORMAL LOW (ref 13.0–17.0)
Immature Granulocytes: 0 %
Lymphocytes Relative: 19 %
Lymphs Abs: 1.7 10*3/uL (ref 0.7–4.0)
MCH: 20.5 pg — ABNORMAL LOW (ref 26.0–34.0)
MCHC: 27 g/dL — ABNORMAL LOW (ref 30.0–36.0)
MCV: 76 fL — ABNORMAL LOW (ref 80.0–100.0)
Monocytes Absolute: 0.8 10*3/uL (ref 0.1–1.0)
Monocytes Relative: 9 %
Neutro Abs: 6.3 10*3/uL (ref 1.7–7.7)
Neutrophils Relative %: 69 %
Platelets: 346 10*3/uL (ref 150–400)
RBC: 4.2 MIL/uL — ABNORMAL LOW (ref 4.22–5.81)
RDW: 24.7 % — ABNORMAL HIGH (ref 11.5–15.5)
WBC: 8.9 10*3/uL (ref 4.0–10.5)
nRBC: 0 % (ref 0.0–0.2)

## 2020-03-29 LAB — HEMOGLOBIN A1C
Hgb A1c MFr Bld: 6.5 % — ABNORMAL HIGH (ref 4.8–5.6)
Mean Plasma Glucose: 139.85 mg/dL

## 2020-03-29 LAB — PREPARE RBC (CROSSMATCH)

## 2020-03-29 NOTE — Pre-Procedure Instructions (Signed)
Dr Constance Haw messaged about H&H. Dr Briant Cedar aware of H&H and cardiac history. No orders given.

## 2020-03-31 ENCOUNTER — Inpatient Hospital Stay (HOSPITAL_COMMUNITY): Payer: Medicaid Other | Admitting: Certified Registered"

## 2020-03-31 ENCOUNTER — Inpatient Hospital Stay (HOSPITAL_COMMUNITY)
Admission: RE | Admit: 2020-03-31 | Discharge: 2020-04-05 | DRG: 331 | Disposition: A | Discharge status: Home / Self Care | Payer: Medicaid Other | Attending: General Surgery | Admitting: General Surgery

## 2020-03-31 ENCOUNTER — Other Ambulatory Visit: Payer: Self-pay

## 2020-03-31 ENCOUNTER — Encounter (HOSPITAL_COMMUNITY): Payer: Self-pay | Admitting: General Surgery

## 2020-03-31 ENCOUNTER — Encounter (HOSPITAL_COMMUNITY)
Admission: RE | Disposition: A | Discharge status: Home / Self Care | Payer: Self-pay | Source: Home / Self Care | Attending: General Surgery

## 2020-03-31 DIAGNOSIS — I252 Old myocardial infarction: Secondary | ICD-10-CM | POA: Diagnosis not present

## 2020-03-31 DIAGNOSIS — Z87891 Personal history of nicotine dependence: Secondary | ICD-10-CM

## 2020-03-31 DIAGNOSIS — M1990 Unspecified osteoarthritis, unspecified site: Secondary | ICD-10-CM | POA: Diagnosis not present

## 2020-03-31 DIAGNOSIS — C187 Malignant neoplasm of sigmoid colon: Principal | ICD-10-CM

## 2020-03-31 DIAGNOSIS — I2510 Atherosclerotic heart disease of native coronary artery without angina pectoris: Secondary | ICD-10-CM | POA: Diagnosis not present

## 2020-03-31 DIAGNOSIS — C189 Malignant neoplasm of colon, unspecified: Secondary | ICD-10-CM | POA: Diagnosis present

## 2020-03-31 DIAGNOSIS — E785 Hyperlipidemia, unspecified: Secondary | ICD-10-CM | POA: Diagnosis present

## 2020-03-31 DIAGNOSIS — Z91018 Allergy to other foods: Secondary | ICD-10-CM

## 2020-03-31 DIAGNOSIS — Z20822 Contact with and (suspected) exposure to covid-19: Secondary | ICD-10-CM | POA: Diagnosis present

## 2020-03-31 DIAGNOSIS — K219 Gastro-esophageal reflux disease without esophagitis: Secondary | ICD-10-CM | POA: Diagnosis not present

## 2020-03-31 DIAGNOSIS — I10 Essential (primary) hypertension: Secondary | ICD-10-CM | POA: Diagnosis present

## 2020-03-31 DIAGNOSIS — Z955 Presence of coronary angioplasty implant and graft: Secondary | ICD-10-CM

## 2020-03-31 HISTORY — PX: PARTIAL COLECTOMY: SHX5273

## 2020-03-31 LAB — RESPIRATORY PANEL BY RT PCR (FLU A&B, COVID)
Influenza A by PCR: NEGATIVE
Influenza B by PCR: NEGATIVE
SARS Coronavirus 2 by RT PCR: NEGATIVE

## 2020-03-31 SURGERY — COLECTOMY, PARTIAL
Anesthesia: General | Site: Abdomen

## 2020-03-31 MED ORDER — BUPIVACAINE LIPOSOME 1.3 % IJ SUSP
INTRAMUSCULAR | Status: DC | PRN
  Administered 2020-03-31: 20 mL

## 2020-03-31 MED ORDER — SODIUM CHLORIDE 0.9 % IV SOLN
2.0000 g | Freq: Two times a day (BID) | INTRAVENOUS | Status: AC
  Administered 2020-03-31 – 2020-04-01 (×3): 2 g via INTRAVENOUS
  Filled 2020-03-31 (×3): qty 2

## 2020-03-31 MED ORDER — CHLORHEXIDINE GLUCONATE 0.12 % MT SOLN
15.0000 mL | Freq: Once | OROMUCOSAL | Status: AC
  Administered 2020-03-31: 15 mL via OROMUCOSAL

## 2020-03-31 MED ORDER — FENTANYL CITRATE (PF) 250 MCG/5ML IJ SOLN
INTRAMUSCULAR | Status: AC
  Filled 2020-03-31: qty 5

## 2020-03-31 MED ORDER — SUCCINYLCHOLINE CHLORIDE 200 MG/10ML IV SOSY
PREFILLED_SYRINGE | INTRAVENOUS | Status: AC
  Filled 2020-03-31: qty 10

## 2020-03-31 MED ORDER — METOPROLOL SUCCINATE ER 25 MG PO TB24
25.0000 mg | ORAL_TABLET | Freq: Every day | ORAL | Status: DC
  Administered 2020-04-01 – 2020-04-05 (×5): 25 mg via ORAL
  Filled 2020-03-31 (×5): qty 1

## 2020-03-31 MED ORDER — ALVIMOPAN 12 MG PO CAPS
ORAL_CAPSULE | ORAL | Status: AC
  Filled 2020-03-31: qty 1

## 2020-03-31 MED ORDER — HYDROMORPHONE HCL 1 MG/ML IJ SOLN
INTRAMUSCULAR | Status: AC
  Filled 2020-03-31: qty 0.5

## 2020-03-31 MED ORDER — ACETAMINOPHEN 500 MG PO TABS
ORAL_TABLET | ORAL | Status: AC
  Filled 2020-03-31: qty 2

## 2020-03-31 MED ORDER — MIDAZOLAM HCL 2 MG/2ML IJ SOLN
INTRAMUSCULAR | Status: AC
  Filled 2020-03-31: qty 2

## 2020-03-31 MED ORDER — DIPHENHYDRAMINE HCL 12.5 MG/5ML PO ELIX: 12.5000 mg | ORAL_SOLUTION | Freq: Four times a day (QID) | ORAL | Status: DC | PRN

## 2020-03-31 MED ORDER — CHLORHEXIDINE GLUCONATE CLOTH 2 % EX PADS: 6.0000 | MEDICATED_PAD | Freq: Once | CUTANEOUS | Status: DC

## 2020-03-31 MED ORDER — LIDOCAINE HCL (CARDIAC) PF 50 MG/5ML IV SOSY
PREFILLED_SYRINGE | INTRAVENOUS | Status: DC | PRN
  Administered 2020-03-31: 100 mg via INTRAVENOUS

## 2020-03-31 MED ORDER — ROCURONIUM BROMIDE 10 MG/ML (PF) SYRINGE
PREFILLED_SYRINGE | INTRAVENOUS | Status: AC
  Filled 2020-03-31: qty 10

## 2020-03-31 MED ORDER — PROPOFOL 10 MG/ML IV BOLUS
INTRAVENOUS | Status: AC
  Filled 2020-03-31: qty 20

## 2020-03-31 MED ORDER — OXYCODONE HCL 5 MG/5ML PO SOLN: 5.0000 mg | Freq: Once | ORAL | Status: DC | PRN

## 2020-03-31 MED ORDER — FUROSEMIDE 20 MG PO TABS
20.0000 mg | ORAL_TABLET | Freq: Every day | ORAL | Status: DC
  Administered 2020-04-01 – 2020-04-05 (×5): 20 mg via ORAL
  Filled 2020-03-31 (×5): qty 1

## 2020-03-31 MED ORDER — ONDANSETRON HCL 4 MG/2ML IJ SOLN
INTRAMUSCULAR | Status: DC | PRN
  Administered 2020-03-31: 4 mg via INTRAVENOUS

## 2020-03-31 MED ORDER — OXYCODONE HCL 5 MG PO TABS: 5.0000 mg | ORAL_TABLET | ORAL | Status: DC | PRN

## 2020-03-31 MED ORDER — GLYCOPYRROLATE 0.2 MG/ML IJ SOLN
INTRAMUSCULAR | Status: DC | PRN
  Administered 2020-03-31: .2 mg via INTRAVENOUS

## 2020-03-31 MED ORDER — SODIUM CHLORIDE 0.9 % IV SOLN
INTRAVENOUS | Status: AC
  Filled 2020-03-31: qty 2

## 2020-03-31 MED ORDER — ENOXAPARIN SODIUM 40 MG/0.4ML ~~LOC~~ SOLN
40.0000 mg | SUBCUTANEOUS | Status: DC
  Administered 2020-04-01 – 2020-04-05 (×5): 40 mg via SUBCUTANEOUS
  Filled 2020-03-31 (×5): qty 0.4

## 2020-03-31 MED ORDER — SUGAMMADEX SODIUM 500 MG/5ML IV SOLN
INTRAVENOUS | Status: DC | PRN
  Administered 2020-03-31: 200 mg via INTRAVENOUS

## 2020-03-31 MED ORDER — SODIUM CHLORIDE 0.9 % IV SOLN
2.0000 g | INTRAVENOUS | Status: AC
  Administered 2020-03-31: 2 g via INTRAVENOUS

## 2020-03-31 MED ORDER — ALUM & MAG HYDROXIDE-SIMETH 200-200-20 MG/5ML PO SUSP: 30.0000 mL | Freq: Four times a day (QID) | ORAL | Status: DC | PRN

## 2020-03-31 MED ORDER — LIDOCAINE 2% (20 MG/ML) 5 ML SYRINGE
INTRAMUSCULAR | Status: AC
  Filled 2020-03-31: qty 5

## 2020-03-31 MED ORDER — ENSURE PRE-SURGERY PO LIQD
592.0000 mL | Freq: Once | ORAL | Status: DC
  Filled 2020-03-31: qty 592

## 2020-03-31 MED ORDER — GABAPENTIN 300 MG PO CAPS
300.0000 mg | ORAL_CAPSULE | Freq: Two times a day (BID) | ORAL | Status: DC
  Administered 2020-03-31 – 2020-04-05 (×10): 300 mg via ORAL
  Filled 2020-03-31 (×11): qty 1

## 2020-03-31 MED ORDER — CHLORHEXIDINE GLUCONATE CLOTH 2 % EX PADS
6.0000 | MEDICATED_PAD | Freq: Every day | CUTANEOUS | Status: DC
  Administered 2020-04-01 – 2020-04-05 (×4): 6 via TOPICAL

## 2020-03-31 MED ORDER — ENSURE ENLIVE PO LIQD
237.0000 mL | Freq: Two times a day (BID) | ORAL | Status: DC
  Administered 2020-04-01 – 2020-04-05 (×9): 237 mL via ORAL

## 2020-03-31 MED ORDER — ROCURONIUM BROMIDE 100 MG/10ML IV SOLN
INTRAVENOUS | Status: DC | PRN
  Administered 2020-03-31: 20 mg via INTRAVENOUS
  Administered 2020-03-31: 100 mg via INTRAVENOUS

## 2020-03-31 MED ORDER — METOPROLOL TARTRATE 5 MG/5ML IV SOLN: 5.0000 mg | Freq: Four times a day (QID) | INTRAVENOUS | Status: DC | PRN

## 2020-03-31 MED ORDER — ACETAMINOPHEN 500 MG PO TABS
1000.0000 mg | ORAL_TABLET | ORAL | Status: AC
  Administered 2020-03-31: 1000 mg via ORAL

## 2020-03-31 MED ORDER — EPHEDRINE SULFATE 50 MG/ML IJ SOLN
INTRAMUSCULAR | Status: DC | PRN
  Administered 2020-03-31 (×3): 10 mg via INTRAVENOUS

## 2020-03-31 MED ORDER — ONDANSETRON HCL 4 MG PO TABS: 4.0000 mg | ORAL_TABLET | Freq: Four times a day (QID) | ORAL | Status: DC | PRN

## 2020-03-31 MED ORDER — MIDAZOLAM HCL 2 MG/2ML IJ SOLN: 0.5000 mg | Freq: Once | INTRAMUSCULAR | Status: DC | PRN

## 2020-03-31 MED ORDER — ENOXAPARIN SODIUM 40 MG/0.4ML ~~LOC~~ SOLN
40.0000 mg | Freq: Once | SUBCUTANEOUS | Status: AC
  Administered 2020-03-31: 40 mg via SUBCUTANEOUS

## 2020-03-31 MED ORDER — ZOLPIDEM TARTRATE 5 MG PO TABS
5.0000 mg | ORAL_TABLET | Freq: Every evening | ORAL | Status: DC | PRN
  Filled 2020-03-31: qty 1

## 2020-03-31 MED ORDER — 0.9 % SODIUM CHLORIDE (POUR BTL) OPTIME
TOPICAL | Status: DC | PRN
  Administered 2020-03-31: 1000 mL

## 2020-03-31 MED ORDER — BUPIVACAINE LIPOSOME 1.3 % IJ SUSP
INTRAMUSCULAR | Status: AC
  Filled 2020-03-31: qty 10

## 2020-03-31 MED ORDER — DIPHENHYDRAMINE HCL 50 MG/ML IJ SOLN: 12.5000 mg | Freq: Four times a day (QID) | INTRAMUSCULAR | Status: DC | PRN

## 2020-03-31 MED ORDER — ENOXAPARIN SODIUM 40 MG/0.4ML ~~LOC~~ SOLN
SUBCUTANEOUS | Status: AC
  Filled 2020-03-31: qty 0.4

## 2020-03-31 MED ORDER — ONDANSETRON HCL 4 MG/2ML IJ SOLN
INTRAMUSCULAR | Status: AC
  Filled 2020-03-31: qty 2

## 2020-03-31 MED ORDER — ALVIMOPAN 12 MG PO CAPS
12.0000 mg | ORAL_CAPSULE | ORAL | Status: AC
  Administered 2020-03-31: 12 mg via ORAL

## 2020-03-31 MED ORDER — ONDANSETRON HCL 4 MG/2ML IJ SOLN: 4.0000 mg | Freq: Once | INTRAMUSCULAR | Status: DC | PRN

## 2020-03-31 MED ORDER — ENSURE PRE-SURGERY PO LIQD
296.0000 mL | Freq: Once | ORAL | Status: DC
  Filled 2020-03-31: qty 296

## 2020-03-31 MED ORDER — PHENYLEPHRINE 40 MCG/ML (10ML) SYRINGE FOR IV PUSH (FOR BLOOD PRESSURE SUPPORT)
PREFILLED_SYRINGE | INTRAVENOUS | Status: AC
  Filled 2020-03-31: qty 10

## 2020-03-31 MED ORDER — LACTATED RINGERS IV SOLN: INTRAVENOUS | Status: DC

## 2020-03-31 MED ORDER — PANTOPRAZOLE SODIUM 40 MG PO TBEC
40.0000 mg | DELAYED_RELEASE_TABLET | Freq: Every day | ORAL | Status: DC
  Administered 2020-04-01 – 2020-04-05 (×5): 40 mg via ORAL
  Filled 2020-03-31 (×5): qty 1

## 2020-03-31 MED ORDER — SIMETHICONE 80 MG PO CHEW: 40.0000 mg | CHEWABLE_TABLET | Freq: Four times a day (QID) | ORAL | Status: DC | PRN

## 2020-03-31 MED ORDER — KETOROLAC TROMETHAMINE 30 MG/ML IJ SOLN
30.0000 mg | Freq: Four times a day (QID) | INTRAMUSCULAR | Status: DC
  Administered 2020-03-31 – 2020-04-01 (×5): 30 mg via INTRAVENOUS
  Filled 2020-03-31 (×4): qty 1

## 2020-03-31 MED ORDER — ACETAMINOPHEN 500 MG PO TABS
1000.0000 mg | ORAL_TABLET | Freq: Four times a day (QID) | ORAL | Status: DC
  Administered 2020-03-31 – 2020-04-05 (×17): 1000 mg via ORAL
  Filled 2020-03-31 (×18): qty 2

## 2020-03-31 MED ORDER — DOCUSATE SODIUM 100 MG PO CAPS
100.0000 mg | ORAL_CAPSULE | Freq: Two times a day (BID) | ORAL | Status: DC
  Administered 2020-03-31 – 2020-04-05 (×5): 100 mg via ORAL
  Filled 2020-03-31 (×9): qty 1

## 2020-03-31 MED ORDER — HYDROMORPHONE HCL 1 MG/ML IJ SOLN
0.2500 mg | INTRAMUSCULAR | Status: DC | PRN
  Administered 2020-03-31: 0.5 mg via INTRAVENOUS

## 2020-03-31 MED ORDER — MIDAZOLAM HCL 2 MG/2ML IJ SOLN
INTRAMUSCULAR | Status: DC | PRN
  Administered 2020-03-31: 2 mg via INTRAVENOUS

## 2020-03-31 MED ORDER — ALVIMOPAN 12 MG PO CAPS
12.0000 mg | ORAL_CAPSULE | Freq: Two times a day (BID) | ORAL | Status: DC
  Administered 2020-04-01: 12 mg via ORAL
  Filled 2020-03-31 (×3): qty 1

## 2020-03-31 MED ORDER — OXYCODONE HCL 5 MG PO TABS: 5.0000 mg | ORAL_TABLET | Freq: Once | ORAL | Status: DC | PRN

## 2020-03-31 MED ORDER — MORPHINE SULFATE (PF) 2 MG/ML IV SOLN: 2.0000 mg | INTRAVENOUS | Status: DC | PRN

## 2020-03-31 MED ORDER — SUCCINYLCHOLINE CHLORIDE 20 MG/ML IJ SOLN
INTRAMUSCULAR | Status: DC | PRN
  Administered 2020-03-31: 200 mg via INTRAVENOUS

## 2020-03-31 MED ORDER — DEXAMETHASONE SODIUM PHOSPHATE 10 MG/ML IJ SOLN
INTRAMUSCULAR | Status: DC | PRN
  Administered 2020-03-31: 4 mg via INTRAVENOUS

## 2020-03-31 MED ORDER — FENTANYL CITRATE (PF) 100 MCG/2ML IJ SOLN
INTRAMUSCULAR | Status: DC | PRN
  Administered 2020-03-31: 150 ug via INTRAVENOUS
  Administered 2020-03-31 (×2): 50 ug via INTRAVENOUS

## 2020-03-31 MED ORDER — PROPOFOL 10 MG/ML IV BOLUS
INTRAVENOUS | Status: DC | PRN
  Administered 2020-03-31: 200 mg via INTRAVENOUS

## 2020-03-31 MED ORDER — NITROGLYCERIN 0.4 MG SL SUBL: 0.4000 mg | SUBLINGUAL_TABLET | SUBLINGUAL | Status: DC | PRN

## 2020-03-31 MED ORDER — ORAL CARE MOUTH RINSE: 15.0000 mL | Freq: Once | OROMUCOSAL | Status: AC

## 2020-03-31 MED ORDER — ONDANSETRON HCL 4 MG/2ML IJ SOLN: 4.0000 mg | Freq: Four times a day (QID) | INTRAMUSCULAR | Status: DC | PRN

## 2020-03-31 MED ORDER — GLYCOPYRROLATE PF 0.2 MG/ML IJ SOSY
PREFILLED_SYRINGE | INTRAMUSCULAR | Status: AC
  Filled 2020-03-31: qty 1

## 2020-03-31 SURGICAL SUPPLY — 61 items
BAG HAMPER (MISCELLANEOUS) ×2 IMPLANT
BARRIER SKIN 2 3/4 (OSTOMY) IMPLANT
CELLS DAT CNTRL 66122 CELL SVR (MISCELLANEOUS) IMPLANT
CLAMP POUCH DRAINAGE QUIET (OSTOMY) IMPLANT
COVER LIGHT HANDLE STERIS (MISCELLANEOUS) ×4 IMPLANT
COVER WAND RF STERILE (DRAPES) ×2 IMPLANT
DRSG OPSITE POSTOP 4X8 (GAUZE/BANDAGES/DRESSINGS) ×2 IMPLANT
ELECT REM PT RETURN 9FT ADLT (ELECTROSURGICAL) ×2
ELECTRODE REM PT RTRN 9FT ADLT (ELECTROSURGICAL) ×1 IMPLANT
GLOVE BIO SURGEON STRL SZ 6.5 (GLOVE) ×4 IMPLANT
GLOVE BIOGEL M 8.0 STRL (GLOVE) ×4 IMPLANT
GLOVE BIOGEL PI IND STRL 6.5 (GLOVE) ×3 IMPLANT
GLOVE BIOGEL PI IND STRL 7.0 (GLOVE) ×6 IMPLANT
GLOVE BIOGEL PI IND STRL 8 (GLOVE) ×3 IMPLANT
GLOVE BIOGEL PI INDICATOR 6.5 (GLOVE) ×3
GLOVE BIOGEL PI INDICATOR 7.0 (GLOVE) ×6
GLOVE BIOGEL PI INDICATOR 8 (GLOVE) ×3
GLOVE SURG SS PI 7.5 STRL IVOR (GLOVE) IMPLANT
GOWN STRL REUS W/TWL LRG LVL3 (GOWN DISPOSABLE) ×12 IMPLANT
INST SET MAJOR GENERAL (KITS) ×2 IMPLANT
KIT BLADEGUARD II DBL (SET/KITS/TRAYS/PACK) ×2 IMPLANT
KIT TURNOVER KIT A (KITS) ×2 IMPLANT
LIGASURE IMPACT 36 18CM CVD LR (INSTRUMENTS) ×2 IMPLANT
MANIFOLD NEPTUNE II (INSTRUMENTS) ×2 IMPLANT
NEEDLE HYPO 18GX1.5 BLUNT FILL (NEEDLE) ×2 IMPLANT
NEEDLE HYPO 22GX1.5 SAFETY (NEEDLE) ×2 IMPLANT
NS IRRIG 1000ML POUR BTL (IV SOLUTION) ×4 IMPLANT
PACK COLON (CUSTOM PROCEDURE TRAY) ×2 IMPLANT
PAD ARMBOARD 7.5X6 YLW CONV (MISCELLANEOUS) ×2 IMPLANT
PENCIL SMOKE EVACUATOR COATED (MISCELLANEOUS) ×2 IMPLANT
POUCH OSTOMY 2 3/4  H 3804 (WOUND CARE)
POUCH OSTOMY 2 PC DRNBL 2.25 (WOUND CARE) IMPLANT
POUCH OSTOMY 2 PC DRNBL 2.75 (WOUND CARE) IMPLANT
POUCH OSTOMY DRNBL 2 1/4 (WOUND CARE)
RELOAD LINEAR CUT PROX 55 BLUE (ENDOMECHANICALS) IMPLANT
RELOAD PROXIMATE 75MM BLUE (ENDOMECHANICALS) ×4 IMPLANT
RETRACTOR WND ALEXIS 25 LRG (MISCELLANEOUS) ×1 IMPLANT
RETRACTOR WND ALEXIS-O 25 LRG (MISCELLANEOUS) ×1 IMPLANT
RTRCTR WOUND ALEXIS 18CM MED (MISCELLANEOUS)
RTRCTR WOUND ALEXIS 25CM LRG (MISCELLANEOUS) ×2
RTRCTR WOUND ALEXIS O 25CM LRG (MISCELLANEOUS) ×2
SHEET LAVH (DRAPES) ×2 IMPLANT
SPONGE LAP 18X18 RF (DISPOSABLE) ×6 IMPLANT
STAPLER CIRC CVD 29MM 37CM (STAPLE) ×4 IMPLANT
STAPLER CUT CVD 40MM GREEN (STAPLE) ×4 IMPLANT
STAPLER GUN LINEAR PROX 60 (STAPLE) ×2 IMPLANT
STAPLER PROXIMATE 55 BLUE (STAPLE) IMPLANT
STAPLER PROXIMATE 75MM BLUE (STAPLE) ×2 IMPLANT
STAPLER SYS INTERNAL RELOAD SS (MISCELLANEOUS) ×2 IMPLANT
STAPLER VISISTAT (STAPLE) ×2 IMPLANT
SUT CHROMIC 0 SH (SUTURE) IMPLANT
SUT CHROMIC 2 0 SH (SUTURE) IMPLANT
SUT CHROMIC 3 0 SH 27 (SUTURE) IMPLANT
SUT PDS AB CT VIOLET #0 27IN (SUTURE) ×6 IMPLANT
SUT PROLENE 2 0 SH 30 (SUTURE) ×2 IMPLANT
SUT SILK 3 0 SH CR/8 (SUTURE) ×4 IMPLANT
SUT VIC AB 3-0 SH 27 (SUTURE) ×1
SUT VIC AB 3-0 SH 27X BRD (SUTURE) ×1 IMPLANT
SYR 20ML LL LF (SYRINGE) ×2 IMPLANT
TRAY FOLEY MTR SLVR 16FR STAT (SET/KITS/TRAYS/PACK) ×2 IMPLANT
YANKAUER SUCT BULB TIP 10FT TU (MISCELLANEOUS) ×2 IMPLANT

## 2020-03-31 NOTE — Interval H&P Note (Signed)
History and Physical Interval Note:  03/31/2020 8:24 AM  Walter Allen  has presented today for surgery, with the diagnosis of Sigmoid colon cancer.  The various methods of treatment have been discussed with the patient and family. After consideration of risks, benefits and other options for treatment, the patient has consented to  Procedure(s) with comments: PARTIAL COLECTOMY (N/A) - pt is an inmate, need covid test AM of procedure - pt to arrive at 6:30 - ok per Burnice Logan as a surgical intervention.  The patient's history has been reviewed, patient examined, no change in status, stable for surgery.  I have reviewed the patient's chart and labs.  Questions were answered to the patient's satisfaction.    No changes.  Virl Cagey

## 2020-03-31 NOTE — Transfer of Care (Signed)
Immediate Anesthesia Transfer of Care Note  Patient: Walter Allen  Procedure(s) Performed: PARTIAL COLECTOMY (N/A Abdomen)  Patient Location: PACU  Anesthesia Type:General  Level of Consciousness: awake, alert , oriented and patient cooperative  Airway & Oxygen Therapy: Patient Spontanous Breathing and Patient connected to nasal cannula oxygen  Post-op Assessment: Report given to RN, Post -op Vital signs reviewed and stable and Patient moving all extremities  Post vital signs: Reviewed and stable  Last Vitals:  Vitals Value Taken Time  BP    Temp    Pulse    Resp    SpO2      Last Pain:  Vitals:   03/31/20 0838  TempSrc: Oral  PainSc: 0-No pain      Patients Stated Pain Goal: 6 (09/47/09 6283)  Complications: No complications documented.

## 2020-03-31 NOTE — Anesthesia Postprocedure Evaluation (Signed)
Anesthesia Post Note  Patient: Walter Allen  Procedure(s) Performed: PARTIAL COLECTOMY (N/A Abdomen)  Patient location during evaluation: PACU Anesthesia Type: General Level of consciousness: awake, oriented, awake and alert and patient cooperative Pain management: pain level controlled Vital Signs Assessment: post-procedure vital signs reviewed and stable Respiratory status: spontaneous breathing, respiratory function stable and nonlabored ventilation Cardiovascular status: blood pressure returned to baseline and stable Postop Assessment: no backache and no headache Anesthetic complications: no   No complications documented.   Last Vitals:  Vitals:   03/31/20 0838  BP: 130/85  Pulse: 68  Resp: 17  Temp: (!) 36.4 C  SpO2: 98%    Last Pain:  Vitals:   03/31/20 0838  TempSrc: Oral  PainSc: 0-No pain                 Tacy Learn

## 2020-03-31 NOTE — Op Note (Signed)
[facility] Operative Note  03/31/20  Preoperative Diagnosis: Sigmoid colon cancer    Postoperative Diagnosis: Same   Procedure(s) Performed:  Lower anterior resection, splenic flexure mobilization    Surgeon: Lanell Matar. Constance Haw, MD   Assistants: Aviva Signs, MD     Anesthesia: General endotracheal   Anesthesiologist: Louann Sjogren, MD    Specimens:  Sigmoid colon (suture proximal), #1 anastomosis with leak (slipped purse-stringer); donut # 1 anastomosis, donut # 2 anastomosis    Estimated Blood Loss: Minimal   Blood Replacement: None    Complications: None   Wound Class: Clean contaminated    Operative Indications:  Mr. 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 We have worked up him and had cardiac clearance performed. We discussed the option of a partial colectomy with anastomosis and discussed the risk of bleeding, infection, anastomosis, leak, and injury to other organs/ ureter, possible ostomy.   Findings: Sigmoid colon cancer    Procedure: The patient was taken to the operating room and placed supine. General endotracheal anesthesia was induced. Intravenous antibiotics were administered per protocol.  A foley catheter was placed.  An orogastric tube positioned to decompress the stomach. The patient was then placed in the lithotomy position with all pressure points padded.  The abdomen and perineum/ anal area were prepared and draped in the usual sterile fashion. A JACHO approved timeout was performed.   A lower midline incision was made and carried down through to the fascia with electrocautery. Care was taken when entering the peritoneum. He had significant pre-peritoneal fat.  Once the peritoneum was entered the abdomen was investigated. No lesions were noted on the liver or peritoneum.  The sigmoid colon cancer was noted.  The small bowel was packed into the right  upper quadrant with laparotomy pads.    The sigmoid colon and left colon were mobilized along the line of Toldt using electrocautery.  This was mobilized up and the splenic flexure was also taken down to ensure adequate length for the anastomosis. The left ureter was identified and protected.   The mesentery was scored inferior from the rectosigmoid junction on both sides of the rectum to free the rectum up further and was continued from the mobilization of the white line of Toldt on the left.    A proximal point of division was taken with a linear cutting 75 mm stapler trying to get at least 5 cm of colon proximal to the cancer.  The distal point of division was taken with TA Contour stapler again attempting to get at least 5 of normal colon distal to the cancer.  The mesentery was taken with a Ligasure as low on the mesenteric pedicle as possible to ensure adequate lymph nod harvest. The left ureter was again protected the whole time. A suture marked proximal on the sigmoid colon specimen.   From here the EEA dilators were placed in the anus by my assistant, and a 29 easily passed. The proximal colon which had adequate length to down into the pelvis.  The proximal colon had excellent blood supply and the mesentery was cleared. The area was toweled off. The automatic purse-stringer was placed and fired.  The anvil was placed inside the colon and the pursestring was tied and tagged. My assistant placed the 29 EEA stapler into the anus under direct visualization and the spike was deployed and pierced just anterior to the staple  line of the distal stump. The bowel with the anvil was engaged with the stapler and the stapler was fired. Just prior to the firing there was a pop and this seemed to be the pursestring failing. The donuts were visualized and there appeared to be portion that was missing on the proximal edge.  From here the anastomosis was checked with a leak test with saline solution air introduced  into the rectum.  There was a leak. The saline was suctioned, and the leak was anterior from where indeed the pursestring had failed.    The anastomosis would have to be redone. A new TA Contour stapler was fired distal to the EEA staple line and a linear cutting 74 mm stapler was fired proximal to the EEA staple line. The failed anastomosis was removed in its entirety.  An additional 2cm at least of distal colon had been taken with the distal from the first anastomosis and the anastomosis itself.   From here the proximal colon staple line was opened and two separate pursestring sutures were ran with a 2-0 prolene suture and tied around a new 29 EEA anvil.  A new 29 EEA stapler was again inserted into the anus under direct visualization and the spike was deployed at the staple line.  The bowel with the anvil and the stapler were engaged and the stapler was fired and the donuts were checked. We had issues removing the donuts from the EEA stapler but they appeared intact on the stapler prior to removal.  These donuts were submitted as #2 anastomosis. A new leak test was done with saline irrigation and air into the rectum, and no leak was noted. The anterior staple line was oversewn with Lembert 3-0 Silk sutures.    Hemostasis was obtained. Irrigation was performed.   The entire team changed gown and gloves and new equipment was used to close the abdomen after new drapes were placed per protocol. The fascia was closed in the standard fashion with 0 PDS suture. Exparel was injected. The incision was irrigated. The skin was closed with staples and a honeycomb dressing was placed.   Dr. Arnoldo Morale was assisting throughout the procedure and was present for the critical portions of the case.   All counts were correct at the end of the case. The patient was awakened from anesthesia and extubated without complication.  The patient went to the PACU in stable condition.   Curlene Labrum, MD [facility] [address] [place], [place] [address] [phone] (office)

## 2020-03-31 NOTE — Anesthesia Procedure Notes (Signed)
Procedure Name: Intubation Performed by: Tacy Learn, CRNA Pre-anesthesia Checklist: Patient identified, Emergency Drugs available, Suction available, Patient being monitored and Timeout performed Patient Re-evaluated:Patient Re-evaluated prior to induction Oxygen Delivery Method: Circle system utilized Preoxygenation: Pre-oxygenation with 100% oxygen Induction Type: IV induction Laryngoscope Size: Mac, 4, Miller and 2 Grade View: Grade I Tube size: 8.0 mm Number of attempts: 2 (one per med student, one crna) Airway Equipment and Method: Stylet Placement Confirmation: ETT inserted through vocal cords under direct vision,  positive ETCO2,  CO2 detector and breath sounds checked- equal and bilateral Secured at: 23 cm Tube secured with: Tape Dental Injury: Teeth and Oropharynx as per pre-operative assessment

## 2020-03-31 NOTE — Anesthesia Preprocedure Evaluation (Signed)
Anesthesia Evaluation    Airway Mallampati: II       Dental  (+) Teeth Intact   Pulmonary former smoker,    breath sounds clear to auscultation       Cardiovascular hypertension, + CAD, + Past MI and + Cardiac Stents       Neuro/Psych negative neurological ROS  negative psych ROS   GI/Hepatic Neg liver ROS, GERD  Medicated,  Endo/Other  negative endocrine ROS  Renal/GU negative Renal ROS  negative genitourinary   Musculoskeletal   Abdominal   Peds  Hematology  (+) Blood dyscrasia, anemia ,   Anesthesia Other Findings   Reproductive/Obstetrics                             Anesthesia Physical Anesthesia Plan  ASA: III  Anesthesia Plan: General   Post-op Pain Management:    Induction:   PONV Risk Score and Plan: 2  Airway Management Planned:   Additional Equipment:   Intra-op Plan:   Post-operative Plan:   Informed Consent: I have reviewed the patients History and Physical, chart, labs and discussed the procedure including the risks, benefits and alternatives for the proposed anesthesia with the patient or authorized representative who has indicated his/her understanding and acceptance.     Dental Advisory Given  Plan Discussed with: CRNA and Surgeon  Anesthesia Plan Comments:         Anesthesia Quick Evaluation

## 2020-03-31 NOTE — Plan of Care (Signed)
  Problem: Education: Goal: Verbalization of understanding of the causes of altered bowel function will improve Outcome: Progressing   Problem: Activity: Goal: Ability to tolerate increased activity will improve Outcome: Progressing   Problem: Bowel/Gastric: Goal: Gastrointestinal status for postoperative course will improve Outcome: Progressing   Problem: Health Behavior/Discharge Planning: Goal: Identification of community resources to assist with postoperative recovery needs will improve Outcome: Progressing   Problem: Clinical Measurements: Goal: Postoperative complications will be avoided or minimized Outcome: Progressing   Problem: Respiratory: Goal: Respiratory status will improve Outcome: Progressing   Problem: Skin Integrity: Goal: Will show signs of wound healing Outcome: Progressing   Problem: Education: Goal: Knowledge of General Education information will improve Description: Including pain rating scale, medication(s)/side effects and non-pharmacologic comfort measures Outcome: Progressing   Problem: Health Behavior/Discharge Planning: Goal: Ability to manage health-related needs will improve Outcome: Progressing   Problem: Clinical Measurements: Goal: Ability to maintain clinical measurements within normal limits will improve Outcome: Progressing Goal: Will remain free from infection Outcome: Progressing Goal: Respiratory complications will improve Outcome: Progressing Goal: Cardiovascular complication will be avoided Outcome: Progressing   Problem: Activity: Goal: Risk for activity intolerance will decrease Outcome: Progressing   Problem: Coping: Goal: Level of anxiety will decrease Outcome: Progressing   Problem: Elimination: Goal: Will not experience complications related to bowel motility Outcome: Progressing Goal: Will not experience complications related to urinary retention Outcome: Progressing   Problem: Pain Managment: Goal: General  experience of comfort will improve Outcome: Progressing   Problem: Safety: Goal: Ability to remain free from injury will improve Outcome: Progressing   Problem: Skin Integrity: Goal: Risk for impaired skin integrity will decrease Outcome: Progressing

## 2020-04-01 ENCOUNTER — Encounter (HOSPITAL_COMMUNITY): Payer: Self-pay | Admitting: General Surgery

## 2020-04-01 LAB — CBC WITH DIFFERENTIAL/PLATELET
Abs Immature Granulocytes: 0.04 10*3/uL (ref 0.00–0.07)
Basophils Absolute: 0 10*3/uL (ref 0.0–0.1)
Basophils Relative: 0 %
Eosinophils Absolute: 0 10*3/uL (ref 0.0–0.5)
Eosinophils Relative: 0 %
HCT: 27.7 % — ABNORMAL LOW (ref 39.0–52.0)
Hemoglobin: 7.6 g/dL — ABNORMAL LOW (ref 13.0–17.0)
Immature Granulocytes: 0 %
Lymphocytes Relative: 8 %
Lymphs Abs: 0.8 10*3/uL (ref 0.7–4.0)
MCH: 20.7 pg — ABNORMAL LOW (ref 26.0–34.0)
MCHC: 27.4 g/dL — ABNORMAL LOW (ref 30.0–36.0)
MCV: 75.5 fL — ABNORMAL LOW (ref 80.0–100.0)
Monocytes Absolute: 1.1 10*3/uL — ABNORMAL HIGH (ref 0.1–1.0)
Monocytes Relative: 10 %
Neutro Abs: 8.8 10*3/uL — ABNORMAL HIGH (ref 1.7–7.7)
Neutrophils Relative %: 82 %
Platelets: 310 10*3/uL (ref 150–400)
RBC: 3.67 MIL/uL — ABNORMAL LOW (ref 4.22–5.81)
RDW: 26 % — ABNORMAL HIGH (ref 11.5–15.5)
WBC: 10.8 10*3/uL — ABNORMAL HIGH (ref 4.0–10.5)
nRBC: 0 % (ref 0.0–0.2)

## 2020-04-01 LAB — BASIC METABOLIC PANEL
Anion gap: 7 (ref 5–15)
BUN: 6 mg/dL — ABNORMAL LOW (ref 8–23)
CO2: 24 mmol/L (ref 22–32)
Calcium: 8.4 mg/dL — ABNORMAL LOW (ref 8.9–10.3)
Chloride: 106 mmol/L (ref 98–111)
Creatinine, Ser: 0.79 mg/dL (ref 0.61–1.24)
GFR calc Af Amer: >60 mL/min (ref >60)
GFR calc non Af Amer: >60 mL/min (ref >60)
Glucose, Bld: 147 mg/dL — ABNORMAL HIGH (ref 70–99)
Potassium: 3.9 mmol/L (ref 3.5–5.1)
Sodium: 137 mmol/L (ref 135–145)

## 2020-04-01 LAB — MAGNESIUM: Magnesium: 1.9 mg/dL (ref 1.7–2.4)

## 2020-04-01 LAB — PHOSPHORUS: Phosphorus: 2.6 mg/dL (ref 2.5–4.6)

## 2020-04-01 NOTE — Progress Notes (Signed)
1 Day Post-Op  Subjective: Patient has mild incisional pain when taking deep breaths in.  Otherwise, his pain is well controlled.  He did pass a small amount of blood per rectum, but he has not had a significant bowel movement or passed flatus yet.  He denies any nausea.  Objective: Vital signs in last 24 hours: Temp:  [97.5 F (36.4 C)-98.9 F (37.2 C)] 98.8 F (37.1 C) (07/15 0558) Pulse Rate:  [68-88] 82 (07/15 0558) Resp:  [14-20] 18 (07/15 0558) BP: (104-138)/(62-85) 104/73 (07/15 0558) SpO2:  [95 %-98 %] 97 % (07/15 0558) Weight:  [101.7 kg-102.1 kg] 101.7 kg (07/15 0558) Last BM Date: 03/31/20  Intake/Output from previous day: 07/14 0701 - 07/15 0700 In: 5238.9 [P.O.:350; I.V.:4688.9; IV Piggyback:200] Out: 2850 [Urine:2700; Blood:150] Intake/Output this shift: No intake/output data recorded.  General appearance: alert, cooperative and no distress Resp: clear to auscultation bilaterally Cardio: regular rate and rhythm, S1, S2 normal, no murmur, click, rub or gallop GI: Soft, dressing dry and intact.  No significant bowel sounds present.  Lab Results:  Recent Labs    03/29/20 1010 04/01/20 0303  WBC 8.9 10.8*  HGB 8.6* 7.6*  HCT 31.9* 27.7*  PLT 346 310   BMET Recent Labs    03/29/20 1010 04/01/20 0303  NA 137 137  K 3.7 3.9  CL 104 106  CO2 24 24  GLUCOSE 178* 147*  BUN 16 6*  CREATININE 0.91 0.79  CALCIUM 9.2 8.4*   PT/INR No results for input(s): LABPROT, INR in the last 72 hours.  Studies/Results: No results found.  Anti-infectives: Anti-infectives (From admission, onward)   Start     Dose/Rate Route Frequency Ordered Stop   03/31/20 2200  cefoTEtan (CEFOTAN) 2 g in sodium chloride 0.9 % 100 mL IVPB     Discontinue     2 g 200 mL/hr over 30 Minutes Intravenous Every 12 hours 03/31/20 1345 04/02/20 0959   03/31/20 0715  cefoTEtan (CEFOTAN) 2 g in sodium chloride 0.9 % 100 mL IVPB        2 g 200 mL/hr over 30 Minutes Intravenous On call to  O.R. 03/31/20 0715 03/31/20 1010      Assessment/Plan: s/p Procedure(s): PARTIAL COLECTOMY Impression: Stable on postoperative day 1.  Awaiting return of bowel function.  Hemoglobin is relatively stable with a decrease expected due to obligate surgical blood loss.  No need for transfusion at this time.  Patient has had excellent urine output and will remove Foley catheter today.  LOS: 1 day    Aviva Signs 04/01/2020

## 2020-04-02 LAB — BPAM RBC
Blood Product Expiration Date: 202108082359
Blood Product Expiration Date: 202108082359
Unit Type and Rh: 6200
Unit Type and Rh: 6200

## 2020-04-02 LAB — TYPE AND SCREEN
ABO/RH(D): A POS
Antibody Screen: NEGATIVE
Unit division: 0
Unit division: 0

## 2020-04-02 LAB — CBC
HCT: 29.1 % — ABNORMAL LOW (ref 39.0–52.0)
Hemoglobin: 8 g/dL — ABNORMAL LOW (ref 13.0–17.0)
MCH: 21.3 pg — ABNORMAL LOW (ref 26.0–34.0)
MCHC: 27.5 g/dL — ABNORMAL LOW (ref 30.0–36.0)
MCV: 77.6 fL — ABNORMAL LOW (ref 80.0–100.0)
Platelets: 292 10*3/uL (ref 150–400)
RBC: 3.75 MIL/uL — ABNORMAL LOW (ref 4.22–5.81)
RDW: 26.5 % — ABNORMAL HIGH (ref 11.5–15.5)
WBC: 6.7 10*3/uL (ref 4.0–10.5)
nRBC: 0 % (ref 0.0–0.2)

## 2020-04-02 LAB — MAGNESIUM: Magnesium: 1.9 mg/dL (ref 1.7–2.4)

## 2020-04-02 LAB — BASIC METABOLIC PANEL
Anion gap: 8 (ref 5–15)
BUN: <5 mg/dL — ABNORMAL LOW (ref 8–23)
CO2: 25 mmol/L (ref 22–32)
Calcium: 8.4 mg/dL — ABNORMAL LOW (ref 8.9–10.3)
Chloride: 107 mmol/L (ref 98–111)
Creatinine, Ser: 0.88 mg/dL (ref 0.61–1.24)
GFR calc Af Amer: >60 mL/min (ref >60)
GFR calc non Af Amer: >60 mL/min (ref >60)
Glucose, Bld: 108 mg/dL — ABNORMAL HIGH (ref 70–99)
Potassium: 3.9 mmol/L (ref 3.5–5.1)
Sodium: 140 mmol/L (ref 135–145)

## 2020-04-02 LAB — PHOSPHORUS: Phosphorus: 2.9 mg/dL (ref 2.5–4.6)

## 2020-04-02 MED ORDER — KETOROLAC TROMETHAMINE 30 MG/ML IJ SOLN: 30.0000 mg | Freq: Four times a day (QID) | INTRAMUSCULAR | Status: DC | PRN

## 2020-04-02 NOTE — Progress Notes (Signed)
[facility] Progress Note  2 Days Post-Op  Subjective: Feeling good. No nausea and having BMs. Some blood still. Wants more than chicken broth. Pathology not back.   Objective: Vital signs in last 24 hours: Temp:  [97.7 F (36.5 C)-98.7 F (37.1 C)] 98.2 F (36.8 C) (07/16 0444) Pulse Rate:  [54-65] 54 (07/15 2053) Resp:  [16-18] 16 (07/16 0444) BP: (105-124)/(71-79) 110/79 (07/16 0444) SpO2:  [95 %-99 %] 99 % (07/15 2053) Weight:  [108.5 kg] 108.5 kg (07/16 0311) Last BM Date: 04/01/20  Intake/Output from previous day: 07/15 0701 - 07/16 0700 In: -  Out: 350 [Urine:350] Intake/Output this shift: No intake/output data recorded.  General appearance: alert, cooperative and no distress Resp: normal work of breathing GI: soft, mildly distended, appropriately tender, honeycomb dressing without erytheam or drainage Extremities: extremities normal, atraumatic, no cyanosis or edema  Lab Results:  Recent Labs    04/01/20 0303 04/02/20 0614  WBC 10.8* 6.7  HGB 7.6* 8.0*  HCT 27.7* 29.1*  PLT 310 292   BMET Recent Labs    04/01/20 0303 04/02/20 0614  NA 137 140  K 3.9 3.9  CL 106 107  CO2 24 25  GLUCOSE 147* 108*  BUN 6* <5*  CREATININE 0.79 0.88  CALCIUM 8.4* 8.4*     Assessment/Plan: Mr. Brookens is a 59 yo s/p lower anterior resection for colon cancer. Doing well and progressing. He does not want to take narcotics. Says his pain is manageable. Wants to be off IV pain medication. Tylenol scheduled, PRN toradol now, PRN narcotics IS, OOB Ambulate Off monitor, on his Metoprolol but not back on his Imdur due to his BP being lower still, will try to get back on this weekend  Soft diet, d/c entereg, monitor BM Abdominal binder Labs in AM, monitor H&H, stabilizing SCDs, lovenox Hopefully back to [employer] [facility] on Monday pending continued improvement over the weekend  Dr. Arnoldo Morale to see this weekend. Patient aware.    LOS: 2 days    Virl Cagey 04/02/2020

## 2020-04-03 LAB — BASIC METABOLIC PANEL WITH GFR
Anion gap: 8 (ref 5–15)
BUN: 5 mg/dL — ABNORMAL LOW (ref 8–23)
CO2: 27 mmol/L (ref 22–32)
Calcium: 8.7 mg/dL — ABNORMAL LOW (ref 8.9–10.3)
Chloride: 104 mmol/L (ref 98–111)
Creatinine, Ser: 0.83 mg/dL (ref 0.61–1.24)
GFR calc Af Amer: >60 mL/min
GFR calc non Af Amer: >60 mL/min
Glucose, Bld: 138 mg/dL — ABNORMAL HIGH (ref 70–99)
Potassium: 3.5 mmol/L (ref 3.5–5.1)
Sodium: 139 mmol/L (ref 135–145)

## 2020-04-03 LAB — CBC
HCT: 29.5 % — ABNORMAL LOW (ref 39.0–52.0)
Hemoglobin: 7.9 g/dL — ABNORMAL LOW (ref 13.0–17.0)
MCH: 20.6 pg — ABNORMAL LOW (ref 26.0–34.0)
MCHC: 26.8 g/dL — ABNORMAL LOW (ref 30.0–36.0)
MCV: 76.8 fL — ABNORMAL LOW (ref 80.0–100.0)
Platelets: 327 10*3/uL (ref 150–400)
RBC: 3.84 MIL/uL — ABNORMAL LOW (ref 4.22–5.81)
RDW: 26.1 % — ABNORMAL HIGH (ref 11.5–15.5)
WBC: 7.7 10*3/uL (ref 4.0–10.5)
nRBC: 0 % (ref 0.0–0.2)

## 2020-04-03 NOTE — Progress Notes (Signed)
3 Days Post-Op  Subjective: Patient has no complaints.  Is having small bowel movements.  Is passing gas.  Tolerating regular diet well.  Objective: Vital signs in last 24 hours: Temp:  [98.6 F (37 C)] 98.6 F (37 C) (07/16 2107) Pulse Rate:  [67-69] 69 (07/16 2107) Resp:  [16-18] 16 (07/16 2107) BP: (109-125)/(71-88) 125/88 (07/16 2107) SpO2:  [95 %-97 %] 97 % (07/16 2107) Last BM Date: 04/02/20  Intake/Output from previous day: No intake/output data recorded. Intake/Output this shift: Total I/O In: 360 [P.O.:360] Out: -   General appearance: alert, cooperative and no distress Resp: clear to auscultation bilaterally Cardio: regular rate and rhythm, S1, S2 normal, no murmur, click, rub or gallop GI: Soft, minimal tenderness.Soft, incision healing well.  Lab Results:  Recent Labs    04/02/20 0614 04/03/20 0701  WBC 6.7 7.7  HGB 8.0* 7.9*  HCT 29.1* 29.5*  PLT 292 327   BMET Recent Labs    04/02/20 0614 04/03/20 0701  NA 140 139  K 3.9 3.5  CL 107 104  CO2 25 27  GLUCOSE 108* 138*  BUN <5* 5*  CREATININE 0.88 0.83  CALCIUM 8.4* 8.7*   PT/INR No results for input(s): LABPROT, INR in the last 72 hours.  Studies/Results: No results found.  Anti-infectives: Anti-infectives (From admission, onward)   Start     Dose/Rate Route Frequency Ordered Stop   03/31/20 2200  cefoTEtan (CEFOTAN) 2 g in sodium chloride 0.9 % 100 mL IVPB        2 g 200 mL/hr over 30 Minutes Intravenous Every 12 hours 03/31/20 1345 04/01/20 2331   03/31/20 0715  cefoTEtan (CEFOTAN) 2 g in sodium chloride 0.9 % 100 mL IVPB        2 g 200 mL/hr over 30 Minutes Intravenous On call to O.R. 03/31/20 0715 03/31/20 1010      Assessment/Plan: s/p Procedure(s): PARTIAL COLECTOMY Imp: Bowel function has returned.  Patient doing well.  Final pathology reveals a T3, N0, M0 colon carcinoma.  Margins clear. Plan: Continue current management.  LOS: 3 days    Aviva Signs 04/03/2020

## 2020-04-04 NOTE — Plan of Care (Signed)
  Problem: Education: Goal: Verbalization of understanding of the causes of altered bowel function will improve Outcome: Progressing   Problem: Activity: Goal: Ability to tolerate increased activity will improve Outcome: Progressing   Problem: Bowel/Gastric: Goal: Gastrointestinal status for postoperative course will improve Outcome: Progressing   Problem: Health Behavior/Discharge Planning: Goal: Identification of community resources to assist with postoperative recovery needs will improve Outcome: Progressing   Problem: Clinical Measurements: Goal: Postoperative complications will be avoided or minimized Outcome: Progressing   Problem: Respiratory: Goal: Respiratory status will improve Outcome: Progressing   Problem: Skin Integrity: Goal: Will show signs of wound healing Outcome: Progressing   Problem: Education: Goal: Knowledge of General Education information will improve Description: Including pain rating scale, medication(s)/side effects and non-pharmacologic comfort measures Outcome: Progressing   Problem: Health Behavior/Discharge Planning: Goal: Ability to manage health-related needs will improve Outcome: Progressing   Problem: Clinical Measurements: Goal: Ability to maintain clinical measurements within normal limits will improve Outcome: Progressing Goal: Will remain free from infection Outcome: Progressing Goal: Respiratory complications will improve Outcome: Progressing Goal: Cardiovascular complication will be avoided Outcome: Progressing   Problem: Activity: Goal: Risk for activity intolerance will decrease Outcome: Progressing   Problem: Coping: Goal: Level of anxiety will decrease Outcome: Progressing   Problem: Elimination: Goal: Will not experience complications related to bowel motility Outcome: Progressing Goal: Will not experience complications related to urinary retention Outcome: Progressing   Problem: Pain Managment: Goal: General  experience of comfort will improve Outcome: Progressing   Problem: Safety: Goal: Ability to remain free from injury will improve Outcome: Progressing   Problem: Skin Integrity: Goal: Risk for impaired skin integrity will decrease Outcome: Progressing

## 2020-04-04 NOTE — Progress Notes (Signed)
4 Days Post-Op  Subjective: Patient has no complaints.  Objective: Vital signs in last 24 hours: Temp:  [97.7 F (36.5 C)-98.6 F (37 C)] 97.7 F (36.5 C) (07/18 0654) Pulse Rate:  [62-70] 62 (07/18 0654) Resp:  [16-18] 18 (07/18 0654) BP: (126-127)/(74-81) 126/81 (07/18 0654) SpO2:  [97 %] 97 % (07/18 0654) Last BM Date: 04/02/20  Intake/Output from previous day: 07/17 0701 - 07/18 0700 In: 1800 [P.O.:1800] Out: -  Intake/Output this shift: No intake/output data recorded.  General appearance: alert, cooperative and no distress Resp: clear to auscultation bilaterally Cardio: regular rate and rhythm, S1, S2 normal, no murmur, click, rub or gallop GI: Soft, incision healing well.  Bowel sounds present.  Lab Results:  Recent Labs    04/02/20 0614 04/03/20 0701  WBC 6.7 7.7  HGB 8.0* 7.9*  HCT 29.1* 29.5*  PLT 292 327   BMET Recent Labs    04/02/20 0614 04/03/20 0701  NA 140 139  K 3.9 3.5  CL 107 104  CO2 25 27  GLUCOSE 108* 138*  BUN <5* 5*  CREATININE 0.88 0.83  CALCIUM 8.4* 8.7*   PT/INR No results for input(s): LABPROT, INR in the last 72 hours.  Studies/Results: No results found.  Anti-infectives: Anti-infectives (From admission, onward)   Start     Dose/Rate Route Frequency Ordered Stop   03/31/20 2200  cefoTEtan (CEFOTAN) 2 g in sodium chloride 0.9 % 100 mL IVPB        2 g 200 mL/hr over 30 Minutes Intravenous Every 12 hours 03/31/20 1345 04/01/20 2331   03/31/20 0715  cefoTEtan (CEFOTAN) 2 g in sodium chloride 0.9 % 100 mL IVPB        2 g 200 mL/hr over 30 Minutes Intravenous On call to O.R. 03/31/20 0715 03/31/20 1010      Assessment/Plan: s/p Procedure(s): PARTIAL COLECTOMY Impression: Postoperative day 4, status post low anterior resection.  Bowel function has fully returned.  Patient is ambulating in the hallway.  He is avoiding narcotics.  Anticipate discharge over the next 24 to 48 hours.  LOS: 4 days    Aviva Signs 04/04/2020

## 2020-04-05 MED ORDER — IBUPROFEN 800 MG PO TABS: 800.0000 mg | ORAL_TABLET | Freq: Three times a day (TID) | ORAL | 0 refills | Status: DC | PRN

## 2020-04-05 MED ORDER — ACETAMINOPHEN ER 650 MG PO TBCR: 1300.0000 mg | EXTENDED_RELEASE_TABLET | Freq: Three times a day (TID) | ORAL | 0 refills | Status: AC

## 2020-04-05 NOTE — Discharge Summary (Signed)
Physician Discharge Summary  Patient ID: Walter Allen MRN: [id number] DOB/AGE: [birth year] 59 y.o.  Admit date: 03/31/2020 Discharge date: 04/05/2020  Admission Diagnoses: Sigmoid colon cancer   Discharge Diagnoses:  Principal Problem:   Malignant neoplasm of sigmoid colon [facility]) Active Problems:   Colon cancer [facility])   Discharged Condition: Good  Hospital Course: Walter Allen is a 59 yo with a sigmoid colon cancer s/p open lower anterior resection (partial colectomy) who did well after surgery. He has been having bowel movements and has been eating and drinking without issues. He had some bloody BMs initially and this is more brown and no blood. He has been having pain control with no narcotic pain medication needed for the last several days. He has been taking tylenol about 3 times a day and he was on a prior regimen of tylenol for his arthritis.  He wants to try to avoid narcotics and continue on tylenol for his pain and has been successful. He reports some soreness of the abdomen and wears a binder as desired to help with the soreness. He has been ambulating and has been able to transfer in and out of the bed without issues. He has been urinating regularly.  The patient reports that he is feeling well and is ready to [facility] back [facility] [place].  He will need follow up with me and oncology in the upcoming weeks.  Pathology: FINAL MICROSCOPIC DIAGNOSIS:   A. COLON, SIGMOID, RESECTION:  - Invasive colorectal adenocarcinoma, 7 cm.  - Adenocarcinoma extends into pericolonic connective tissue.  - Margins not involved.  - Nineteen benign lymph nodes (0/19).  - See oncology table.   B. COLON, 1ST DONUT ANASTOMOSIS, RESECTION:  - Benign colon.  - No evidence of malignancy.   C. COLON, 2ND DONUT ANASTOMOSIS, RESECTION:  - Benign colon.  - No evidence of malignancy.   ONCOLOGY TABLE:  A. COLON AND RECTUM: Resection  Procedure: Partial colectomy.  Tumor Site: Sigmoid.  Tumor Size: 7 x  6 cm.  Macroscopic Tumor Perforation: Not present.  Histologic Type: Colorectal adenocarcinoma.  Histologic Grade: Moderately differentiated.  Tumor Extension: Extends into pericolonic connective tissue.  Margins: Not involved by carcinoma.  Treatment Effect: No known presurgical therapy.  Lymphovascular Invasion: Not identified.  Perineural Invasion: Not identified.  Tumor Deposits: Not identified.  Regional Lymph Nodes:    Number of Lymph Nodes Involved: 0    Number of Lymph Nodes Examined: 19  Pathologic Stage Classification (pTNM, AJCC 8th Edition): pT3, pN0  Ancillary Studies: MMR and MSI testing pending.  Representative Tumor Block: A3, A4 and A5.  (v4.1.0.0)   GROSS DESCRIPTION:  Specimen A: Sigmoid resection, received in formalin, suture at proximal. Also separate portion of colon clinically identified as anastomosis with leak.  Specimen integrity: Sigmoid resection is intact.  Specimen length: 14 cm  Mesorectal intactness: Not applicable  Tumor location: Sigmoid  Tumor size: 7 cm in length, 6 cm in width, tan-pink to pink-red firm sessile mass with possible central ulceration.  Percent of bowel circumference involved: 100%  Tumor distance to margins:            Proximal: 4 cm            Distal: 3 cm           Mesenteric (sigmoid and transverse): 5 cm  Macroscopic extent of tumor invasion: The tumor invades through entire thickness of muscularis, and minimally into underlying fat. The tumor abuts serosa with possible focal involvement of serosa.  Total presumed lymph nodes: Found are 21 possible lymph nodes which range from 0.3 to 1.3 cm.  Extramural satellite tumor nodules: None  Mucosal polyp(s): None  Additional findings: The separate portion of: Clinically identified as anastomosis with leak consists of a 4.2 x 3.6 x 1.8 cm irregular portion of intestine which has a 1.2 cm in diameter open area 1 aspect, which is flanked by 2  staple lines, and the opposite surface has smooth serosa with a line of embedded metallic clips. This open area is lined by tan-pink smooth, soft mucosa, and additional sections through the specimen are unremarkable.  Block summary:  Block 1 = proximal margin  Block 2 = distal margin  Block 3 = section of tumor with grossly deepest involvement of  underlying fat.  Blocks 4, 5 = tumor and possible involvement of serosa  Block 6 = tumor and adjacent mucosa  Block 7 = tissue for molecular testing  Block 8 = 4 possible nodes  Blocks 9 = 4 possible nodes  Block 10 = 4 possible nodes  Block 11 = 4 possible nodes  Block 12 = 3 possible nodes  Block 13 = 1 node bisected  Block 14 = 1 node bisected  Block 15 = representative sections of separate portion of colon  clinically identified as anastomosis Specimen B: Received in formalin are 2 portions of tan-pink to dark red smooth, soft mucosa: A 1.5 cm in diameter and up to 0.7 cm thick ring of mucosa with underlying metallic staples, and a 3.1 x 1.6 x 1.1 cm irregular piece with few pieces of embedded blue suture material. Representative sections of each are submitted in 1 block. Specimen C: Received in formalin is a 1.8 cm in diameter and up to 1.3 cm thick focally disrupted ring of tan-pink smooth, soft mucosa with underlying embedded suture material. A representative section is submitted 1 block. SW 04/01/2020   Final Diagnosis performed by Claudette Laws, MD.  Electronically signed 04/02/2020   Consults: None  Significant Diagnostic Studies:  Results for Walter Allen (MRN [id number]) as of 04/05/2020 11:46  Ref. Range 04/02/2020 06:14 04/03/2020 07:01  Sodium Latest Ref Range: 135 - 145 mmol/L 140 139  Potassium Latest Ref Range: 3.5 - 5.1 mmol/L 3.9 3.5  Chloride Latest Ref Range: 98 - 111 mmol/L 107 104  CO2 Latest Ref Range: 22 - 32 mmol/L 25 27  Glucose Latest Ref Range: 70 - 99 mg/dL 108 (H) 138 (H)  BUN Latest Ref Range: 8 - 23 mg/dL <5  (L) 5 (L)  Creatinine Latest Ref Range: 0.61 - 1.24 mg/dL 0.88 0.83  Calcium Latest Ref Range: 8.9 - 10.3 mg/dL 8.4 (L) 8.7 (L)  Anion gap Latest Ref Range: 5 - '15  8 8  ' Phosphorus Latest Ref Range: 2.5 - 4.6 mg/dL 2.9   Magnesium Latest Ref Range: 1.7 - 2.4 mg/dL 1.9   GFR, Est Non African American Latest Ref Range: >60 mL/min >60 >60  GFR, Est African American Latest Ref Range: >60 mL/min >60 >60  WBC Latest Ref Range: 4.0 - 10.5 K/uL 6.7 7.7  RBC Latest Ref Range: 4.22 - 5.81 MIL/uL 3.75 (L) 3.84 (L)  Hemoglobin Latest Ref Range: 13.0 - 17.0 g/dL 8.0 (L) 7.9 (L)  HCT Latest Ref Range: 39 - 52 % 29.1 (L) 29.5 (L)  MCV Latest Ref Range: 80.0 - 100.0 fL 77.6 (L) 76.8 (L)  MCH Latest Ref Range: 26.0 - 34.0 pg 21.3 (L) 20.6 (L)  MCHC Latest Ref Range:  30.0 - 36.0 g/dL 27.5 (L) 26.8 (L)  RDW Latest Ref Range: 11.5 - 15.5 % 26.5 (H) 26.1 (H)  Platelets Latest Ref Range: 150 - 400 K/uL 292 327  nRBC Latest Ref Range: 0.0 - 0.2 % 0.0 0.0   Treatments: IV hydration and Lower anterior resection with primary end to end anastomosis, splenic flexure mobilization 03/31/2020  Discharge Exam: Blood pressure 118/80, pulse 70, temperature 98.8 F (37.1 C), temperature source Oral, resp. rate 15, height '5\' 9"'  (1.753 m), weight 108.5 kg, SpO2 97 %. General appearance: alert, cooperative and no distress Resp: normal work of breathing GI: soft, appropriately tender, nondistended, staples c/d/i with no erythema or drainage Extremities: extremities normal, atraumatic, no cyanosis or edema  Disposition: Discharge disposition: 01-Home or Self Care       Discharge Instructions    Call MD for:  difficulty breathing, headache or visual disturbances   Complete by: As directed    Call MD for:  extreme fatigue   Complete by: As directed    Call MD for:  persistant dizziness or light-headedness   Complete by: As directed    Call MD for:  persistant nausea and vomiting   Complete by: As directed    Call  MD for:  redness, tenderness, or signs of infection (pain, swelling, redness, odor or green/yellow discharge around incision site)   Complete by: As directed    Call MD for:  severe uncontrolled pain   Complete by: As directed    Call MD for:  temperature >100.4   Complete by: As directed    Discharge instructions   Complete by: As directed    Discharge Open Abdominal Surgery Instructions:  Common Complaints: Pain at the incision site is common. This will improve with time. Take your pain medications as described below. Some nausea is common and poor appetite. The main goal is to stay hydrated the first few days after surgery.   Diet/ Activity: Diet as tolerated. You have started and tolerated a diet in the hospital, and should continue to increase what you are able to eat.   You may not have a large appetite, but it is important to stay hydrated. Drink 64 ounces of water a day. Your appetite will return with time.  Keep a dry dressing in place over your staples daily or as needed. Some minor pink/ blood tinged drainage is expected. This will stop in a few days after surgery.  Shower per your regular routine daily.  Do not take hot showers. Take warm showers that are less than 10 minutes. Pat the incision dry. Wear an abdominal binder daily with activity. You do not have to wear this while sleeping or sitting.  Rest and listen to your body, but do not remain in bed all day.  Walk everyday for at least 15-20 minutes. Deep cough and move around every 1-2 hours in the first few days after surgery.  Do not lift > 10 lbs, perform excessive bending, pushing, pulling, squatting for 6-8 weeks after surgery.  The activity restrictions and the abdominal binder are to prevent hernia formation at your incision while you are healing.  Do not place lotions or balms on your incision unless instructed to specifically by Dr. Constance Haw.   Pain Expectations and Narcotics: -After surgery you will have pain  associated with your incisions and this is normal. The pain is muscular and nerve pain, and will get better with time. -You are encouraged and expected to take non narcotic medications like  tylenol and ibuprofen (when able) to treat pain as multiple modalities can aid with pain treatment. -You are not expected to have a pain score of 0 after surgery, as we cannot prevent pain. A pain score of 3-4 that allows you to be functional, move, walk, and tolerate some activity is the goal. The pain will continue to improve over the days after surgery and is dependent on your surgery. -Having appropriate expectations of pain and knowledge of pain management with non narcotics is important as we do not want anyone to become addicted to narcotic pain medication.  -Using ice packs in the first 48 hours and heating pads after 48 hours, wearing an abdominal binder (when recommended), and using over the counter medications are all ways to help with pain management.   -Simple acts like meditation and mindfulness practices after surgery can also help with pain control and research has proven the benefit of these practices.  Medication: Take tylenol and ibuprofen as needed for pain control, alternating every 4-6 hours.  Take a stool softener as needed for constipation and to keep your stools soft.  Drink plenty of water to also prevent constipation.   Contact Information: If you have questions or concerns, please call our office, [phone], Monday- Thursday 8AM-5PM and Friday 8AM-12Noon.  If it is after hours or on the weekend, please call [facility] Main Number, [phone], and ask to speak to the surgeon on call for Dr. Constance Haw at [facility].   Increase activity slowly   Complete by: As directed      Allergies as of 04/05/2020      Reactions   Kale Nausea And Vomiting   Collard Starbuck Sink Virosa]    Other Nausea And Vomiting   Mustard greens, collards, kale, turnip greens, plack eyed peas        Medication List    TAKE these medications   acetaminophen 650 MG CR tablet Commonly known as: TYLENOL Take 2 tablets (1,300 mg total) by mouth 3 (three) times daily.   aspirin EC 81 MG tablet Take 81 mg by mouth daily. Swallow whole.   atorvastatin 80 MG tablet Commonly known as: LIPITOR Take 80 mg by mouth daily.   docusate sodium 100 MG capsule Commonly known as: COLACE Take 100 mg by mouth 2 (two) times daily.   ferrous sulfate 325 (65 FE) MG tablet 1 tablet twice a day with meals   furosemide 20 MG tablet Commonly known as: LASIX Take 20 mg by mouth daily.   ibuprofen 800 MG tablet Commonly known as: ADVIL Take 1 tablet (800 mg total) by mouth every 8 (eight) hours as needed for mild pain or moderate pain. Do not take on an empty stomach.   isosorbide mononitrate 30 MG 24 hr tablet Commonly known as: IMDUR Take 30 mg by mouth daily.   metoprolol succinate 25 MG 24 hr tablet Commonly known as: TOPROL-XL Take 25 mg by mouth daily.   nitroGLYCERIN 0.4 MG SL tablet Commonly known as: NITROSTAT Place 0.4 mg under the tongue every 5 (five) minutes x 3 doses as needed for chest pain.   omeprazole 20 MG capsule Commonly known as: PRILOSEC Take 1 capsule (20 mg total) by mouth 2 (two) times daily before a meal.   spironolactone 25 MG tablet Commonly known as: ALDACTONE Take 25 mg by mouth daily.   TGT Saline Laxative Enem Place 1 Dose rectally once.         Discharge Open Abdominal Surgery Instructions:  Common Complaints:  Pain at the incision site is common. This will improve with time. Take your pain medications as described below. Some nausea is common and poor appetite. The main goal is to stay hydrated the first few days after surgery.   Diet/ Activity: Diet as tolerated. You have started and tolerated a diet in the hospital, and should continue to increase what you are able to eat.   You may not have a large appetite, but it is important to stay  hydrated. Drink 64 ounces of water a day. Your appetite will return with time.  Keep a dry dressing in place over your staples daily or as needed. Some minor pink/ blood tinged drainage is expected. This will stop in a few days after surgery.  Shower per your regular routine daily.  Do not take hot showers. Take warm showers that are less than 10 minutes. Pat the incision dry. Wear an abdominal binder daily with activity. You do not have to wear this while sleeping or sitting.  Rest and listen to your body, but do not remain in bed all day.  Walk everyday for at least 15-20 minutes. Deep cough and move around every 1-2 hours in the first few days after surgery.  Do not lift > 10 lbs, perform excessive bending, pushing, pulling, squatting for 6-8 weeks after surgery.  The activity restrictions and the abdominal binder are to prevent hernia formation at your incision while you are healing.  Do not place lotions or balms on your incision unless instructed to specifically by Dr. Constance Haw.   Pain Expectations and Narcotics: -After surgery you will have pain associated with your incisions and this is normal. The pain is muscular and nerve pain, and will get better with time. -You are encouraged and expected to take non narcotic medications like tylenol and ibuprofen (when able) to treat pain as multiple modalities can aid with pain treatment. -You are not expected to have a pain score of 0 after surgery, as we cannot prevent pain. A pain score of 3-4 that allows you to be functional, move, walk, and tolerate some activity is the goal. The pain will continue to improve over the days after surgery and is dependent on your surgery. -Having appropriate expectations of pain and knowledge of pain management with non narcotics is important as we do not want anyone to become addicted to narcotic pain medication.  -Using ice packs in the first 48 hours and heating pads after 48 hours, wearing an abdominal binder  (when recommended), and using over the counter medications are all ways to help with pain management.   -Simple acts like meditation and mindfulness practices after surgery can also help with pain control and research has proven the benefit of these practices.  Medication: Take tylenol and ibuprofen as needed for pain control, alternating every 4-6 hours.  Take a stool softener as needed for constipation and to keep your stools soft.  Drink plenty of water to also prevent constipation.   Contact Information: If you have questions or concerns, please call our office, [phone], Monday- Thursday 8AM-5PM and Friday 8AM-12Noon.  If it is after hours or on the weekend, please call [facility] Main Number, [phone], and ask to speak to the surgeon on call for Dr. Constance Haw at [facility].    Open Colectomy, Care After This sheet gives you information about how to care for yourself after your procedure. Your health care provider may also give you more specific instructions. If you have problems or questions, contact  your health care provider. What can I expect after the procedure? After the procedure, it is common to have:  Pain in your abdomen, especially along your incision.  Tiredness. Your energy level will return to normal over the next several weeks.  Constipation.  Nausea.  Difficulty urinating. Follow these instructions at home: Activity  You may be able to return to most of your normal activities within 1-2 weeks, such as working, walking up stairs, and sexual activity.  Avoid activities that require a lot of energy for 4-6 weeks after surgery, such as running, climbing, and lifting heavy objects. Ask your health care provider what activities are safe for you.  Take rest breaks during the day as needed.  Do not drive for 1-2 weeks or until your health care provider says that it is safe.  Do not drive or use heavy machinery while taking prescription pain medicines.  Do not  lift anything that is heavier than 10 lb (4.3 kg) for 6-8 weeks.  Incision care   Follow instructions from your health care provider about how to take care of your incision. Make sure you: ? Wash your hands with soap and water before you change your bandage (dressing). If soap and water are not available, use hand sanitizer. ? Change your dressing as told by your health care provider. ? Leave stitches (sutures) or staples in place. These skin closures may need to stay in place for 2 weeks or longer.  Avoid wearing tight clothing around your incision.  Protect your incision area from the sun.  Check your incision area every day for signs of infection. Check for: ? More redness, swelling, or pain. ? More fluid or blood. ? Warmth. ? Pus or a bad smell. General instructions  Do not take baths, swim, or use a hot tub until your health care provider approves.   You may shower.  Take over-the-counter and prescription medicines, including stool softeners, only as told by your health care provider.  Eat a low-fat and low-fiber diet for the first 4 weeks after surgery.  Keep all follow-up visits as told by your health care provider. This is important. Contact a health care provider if:  You have more redness, swelling, or pain around your incision.  You have more fluid or blood coming from your incision.  Your incision feels warm to the touch.  You have pus or a bad smell coming from your incision.  You have a fever or chills.  You do not have a bowel movement 2-3 days after surgery.  You cannot eat or drink for 24 hours or more.  You have persistent nausea and vomiting.  You have abdominal pain that gets worse and does not get better with medicine. Get help right away if:  You have chest pain.  You have shortness of breath.  You have pain or swelling in your legs.  Your incision breaks open after your sutures or staples have been removed.  You have bleeding from the  rectum. This information is not intended to replace advice given to you by your health care provider. Make sure you discuss any questions you have with your health care provider. Document Revised: 08/17/2017 Document Reviewed: 06/05/2016 Elsevier Patient Education  2020 [employer].  Future Appointments  Date Time Provider [place]  04/13/2020 10:00 AM Virl Cagey, MD [facility] None  04/19/2020  1:30 PM Derek Jack, MD [facility] None     Signed: Virl Cagey 04/05/2020, 11:49 AM

## 2020-04-05 NOTE — Plan of Care (Signed)
  Problem: Education: Goal: Verbalization of understanding of the causes of altered bowel function will improve Outcome: Adequate for Discharge   Problem: Activity: Goal: Ability to tolerate increased activity will improve Outcome: Adequate for Discharge   Problem: Bowel/Gastric: Goal: Gastrointestinal status for postoperative course will improve Outcome: Adequate for Discharge   Problem: Health Behavior/Discharge Planning: Goal: Identification of community resources to assist with postoperative recovery needs will improve Outcome: Adequate for Discharge   Problem: Clinical Measurements: Goal: Postoperative complications will be avoided or minimized Outcome: Adequate for Discharge   Problem: Respiratory: Goal: Respiratory status will improve Outcome: Adequate for Discharge   Problem: Skin Integrity: Goal: Will show signs of wound healing Outcome: Adequate for Discharge   Problem: Education: Goal: Knowledge of General Education information will improve Description: Including pain rating scale, medication(s)/side effects and non-pharmacologic comfort measures Outcome: Adequate for Discharge   Problem: Health Behavior/Discharge Planning: Goal: Ability to manage health-related needs will improve Outcome: Adequate for Discharge   Problem: Clinical Measurements: Goal: Ability to maintain clinical measurements within normal limits will improve Outcome: Adequate for Discharge Goal: Will remain free from infection Outcome: Adequate for Discharge Goal: Respiratory complications will improve Outcome: Adequate for Discharge Goal: Cardiovascular complication will be avoided Outcome: Adequate for Discharge   Problem: Activity: Goal: Risk for activity intolerance will decrease Outcome: Adequate for Discharge   Problem: Coping: Goal: Level of anxiety will decrease Outcome: Adequate for Discharge   Problem: Elimination: Goal: Will not experience complications related to bowel  motility Outcome: Adequate for Discharge Goal: Will not experience complications related to urinary retention Outcome: Adequate for Discharge   Problem: Pain Managment: Goal: General experience of comfort will improve Outcome: Adequate for Discharge   Problem: Safety: Goal: Ability to remain free from injury will improve Outcome: Adequate for Discharge   Problem: Skin Integrity: Goal: Risk for impaired skin integrity will decrease Outcome: Adequate for Discharge

## 2020-04-05 NOTE — Progress Notes (Signed)
Received call back from [facility] Board. Patient is good to return to his prior facility.   Curlene Labrum, MD

## 2020-04-05 NOTE — Discharge Instructions (Signed)
Discharge Open Abdominal Surgery Instructions:  Common Complaints: Pain at the incision site is common. This will improve with time. Take your pain medications as described below. Some nausea is common and poor appetite. The main goal is to stay hydrated the first few days after surgery.   Diet/ Activity: Diet as tolerated. You have started and tolerated a diet in the hospital, and should continue to increase what you are able to eat.   You may not have a large appetite, but it is important to stay hydrated. Drink 64 ounces of water a day. Your appetite will return with time.  Keep a dry dressing in place over your staples daily or as needed. Some minor pink/ blood tinged drainage is expected. This will stop in a few days after surgery.  Shower per your regular routine daily.  Do not take hot showers. Take warm showers that are less than 10 minutes. Pat the incision dry. Wear an abdominal binder daily with activity. You do not have to wear this while sleeping or sitting.  Rest and listen to your body, but do not remain in bed all day.  Walk everyday for at least 15-20 minutes. Deep cough and move around every 1-2 hours in the first few days after surgery.  Do not lift > 10 lbs, perform excessive bending, pushing, pulling, squatting for 6-8 weeks after surgery.  The activity restrictions and the abdominal binder are to prevent hernia formation at your incision while you are healing.  Do not place lotions or balms on your incision unless instructed to specifically by Dr. Constance Haw.   Pain Expectations and Narcotics: -After surgery you will have pain associated with your incisions and this is normal. The pain is muscular and nerve pain, and will get better with time. -You are encouraged and expected to take non narcotic medications like tylenol and ibuprofen (when able) to treat pain as multiple modalities can aid with pain treatment. -You are not expected to have a pain score of 0 after surgery, as  we cannot prevent pain. A pain score of 3-4 that allows you to be functional, move, walk, and tolerate some activity is the goal. The pain will continue to improve over the days after surgery and is dependent on your surgery. -Having appropriate expectations of pain and knowledge of pain management with non narcotics is important as we do not want anyone to become addicted to narcotic pain medication.  -Using ice packs in the first 48 hours and heating pads after 48 hours, wearing an abdominal binder (when recommended), and using over the counter medications are all ways to help with pain management.   -Simple acts like meditation and mindfulness practices after surgery can also help with pain control and research has proven the benefit of these practices.  Medication: Take tylenol and ibuprofen as needed for pain control, alternating every 4-6 hours.  Take a stool softener as needed for constipation and to keep your stools soft.  Drink plenty of water to also prevent constipation.   Contact Information: If you have questions or concerns, please call our office, [phone], Monday- Thursday 8AM-5PM and Friday 8AM-12Noon.  If it is after hours or on the weekend, please call [facility] Main Number, [phone], and ask to speak to the surgeon on call for Dr. Constance Haw at [facility].    Open Colectomy, Care After This sheet gives you information about how to care for yourself after your procedure. Your health care provider may also give you more specific instructions.  If you have problems or questions, contact your health care provider. What can I expect after the procedure? After the procedure, it is common to have:  Pain in your abdomen, especially along your incision.  Tiredness. Your energy level will return to normal over the next several weeks.  Constipation.  Nausea.  Difficulty urinating. Follow these instructions at home: Activity  You may be able to return to most of your normal  activities within 1-2 weeks, such as working, walking up stairs, and sexual activity.  Avoid activities that require a lot of energy for 4-6 weeks after surgery, such as running, climbing, and lifting heavy objects. Ask your health care provider what activities are safe for you.  Take rest breaks during the day as needed.  Do not drive for 1-2 weeks or until your health care provider says that it is safe.  Do not drive or use heavy machinery while taking prescription pain medicines.  Do not lift anything that is heavier than 10 lb (4.3 kg) for 6-8 weeks.  Incision care   Follow instructions from your health care provider about how to take care of your incision. Make sure you: ? Wash your hands with soap and water before you change your bandage (dressing). If soap and water are not available, use hand sanitizer. ? Change your dressing as told by your health care provider. ? Leave stitches (sutures) or staples in place. These skin closures may need to stay in place for 2 weeks or longer.  Avoid wearing tight clothing around your incision.  Protect your incision area from the sun.  Check your incision area every day for signs of infection. Check for: ? More redness, swelling, or pain. ? More fluid or blood. ? Warmth. ? Pus or a bad smell. General instructions  Do not take baths, swim, or use a hot tub until your health care provider approves.   You may shower.  Take over-the-counter and prescription medicines, including stool softeners, only as told by your health care provider.  Eat a low-fat and low-fiber diet for the first 4 weeks after surgery.  Keep all follow-up visits as told by your health care provider. This is important. Contact a health care provider if:  You have more redness, swelling, or pain around your incision.  You have more fluid or blood coming from your incision.  Your incision feels warm to the touch.  You have pus or a bad smell coming from your  incision.  You have a fever or chills.  You do not have a bowel movement 2-3 days after surgery.  You cannot eat or drink for 24 hours or more.  You have persistent nausea and vomiting.  You have abdominal pain that gets worse and does not get better with medicine. Get help right away if:  You have chest pain.  You have shortness of breath.  You have pain or swelling in your legs.  Your incision breaks open after your sutures or staples have been removed.  You have bleeding from the rectum. This information is not intended to replace advice given to you by your health care provider. Make sure you discuss any questions you have with your health care provider. Document Revised: 08/17/2017 Document Reviewed: 06/05/2016 Elsevier Patient Education  2020 [employer].

## 2020-04-05 NOTE — Progress Notes (Signed)
[facility]  Spoke with Ms. Hurdle at his [facility] [place] [phone] ex [phone] and instructed to call the UR board [phone].  Spoke with RN at the [employer] and she is going to relay message to Ms. Toussaint, RN, the RN in charge of his case.   Requested that we fax a copy of the discharge summary to [phone] Care of Ms. Francis Gaines, Therapist, sports.    Notified that patient only taking tylenol three times a day and he had been on this previously for his arthritis, and that he can have ibuprofen PRN.  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  Staples out on 7/27/2021and Oncology follow up in the upcoming weeks.   Future Appointments  Date Time Provider [place]  04/13/2020 10:00 AM Virl Cagey, MD [facility] None  04/19/2020  1:30 PM Derek Jack, MD AP[facility] None     Curlene Labrum, MD [facility] [address] [place], [place] [address] [phone] (office)

## 2020-04-06 LAB — SURGICAL PATHOLOGY

## 2020-04-13 ENCOUNTER — Ambulatory Visit: Payer: Self-pay | Admitting: General Surgery

## 2020-04-15 ENCOUNTER — Ambulatory Visit (INDEPENDENT_AMBULATORY_CARE_PROVIDER_SITE_OTHER): Payer: Self-pay | Admitting: General Surgery

## 2020-04-15 ENCOUNTER — Encounter: Payer: Self-pay | Admitting: General Surgery

## 2020-04-15 ENCOUNTER — Other Ambulatory Visit: Payer: Self-pay

## 2020-04-15 VITALS — BP 134/78 | HR 67 | Temp 99.0°F | Resp 16 | Ht 69.0 in

## 2020-04-15 DIAGNOSIS — B372 Candidiasis of skin and nail: Secondary | ICD-10-CM

## 2020-04-15 DIAGNOSIS — C187 Malignant neoplasm of sigmoid colon: Secondary | ICD-10-CM

## 2020-04-15 MED ORDER — FLUCONAZOLE 50 MG PO TABS: 150.0000 mg | ORAL_TABLET | Freq: Every day | ORAL | 0 refills | Status: DC

## 2020-04-15 NOTE — Patient Instructions (Addendum)
Keep the wound covered with dry gauze and papertape. Keep area dry and notify if worsening redness or drainage from the inferior part of wound. Use the rectal cream up to 6 times daily to help with pain with bowel movements.    ** GET Diflucan prescription from [facility]. ** This is for your lower incision redness/ possible yeast infection.    Implanted [facility] Guide An implanted port is a device that is placed under the skin. It is usually placed in the chest. The device can be used to give IV medicine, to take blood, or for dialysis. You may have an implanted port if:  You need IV medicine that would be irritating to the small veins in your hands or arms.  You need IV medicines, such as antibiotics, for a long period of time.  You need IV nutrition for a long period of time.  You need dialysis. Having a port means that your health care provider will not need to use the veins in your arms for these procedures. You may have fewer limitations when using a port than you would if you used other types of long-term IVs, and you will likely be able to return to normal activities after your incision heals. An implanted port has two main parts:  Reservoir. The reservoir is the part where a needle is inserted to give medicines or draw blood. The reservoir is round. After it is placed, it appears as a small, raised area under your skin.  Catheter. The catheter is a thin, flexible tube that connects the reservoir to a vein. Medicine that is inserted into the reservoir goes into the catheter and then into the vein. How is my port accessed? To access your port:  A numbing cream may be placed on the skin over the port site.  Your health care provider will put on a mask and sterile gloves.  The skin over your port will be cleaned carefully with a germ-killing soap and allowed to dry.  Your health care provider will gently pinch the port and insert a needle into it.  Your health care  provider will check for a blood return to make sure the port is in the vein and is not clogged.  If your port needs to remain accessed to get medicine continuously (constant infusion), your health care provider will place a clear bandage (dressing) over the needle site. The dressing and needle will need to be changed every week, or as told by your health care provider. What is flushing? Flushing helps keep the port from getting clogged. Follow instructions from your health care provider about how and when to flush the port. Ports are usually flushed with saline solution or a medicine called heparin. The need for flushing will depend on how the port is used:  If the port is only used from time to time to give medicines or draw blood, the port may need to be flushed: ? Before and after medicines have been given. ? Before and after blood has been drawn. ? As part of routine maintenance. Flushing may be recommended every 4-6 weeks.  If a constant infusion is running, the port may not need to be flushed.  Throw away any syringes in a disposal container that is meant for sharp items (sharps container). You can buy a sharps container from a pharmacy, or you can make one by using an empty hard plastic bottle with a cover. How long will my port stay implanted? The port  can stay in for as long as your health care provider thinks it is needed. When it is time for the port to come out, a surgery will be done to remove it. The surgery will be similar to the procedure that was done to put the port in. Follow these instructions at home:   Flush your port as told by your health care provider.  If you need an infusion over several days, follow instructions from your health care provider about how to take care of your port site. Make sure you: ? Wash your hands with soap and water before you change your dressing. If soap and water are not available, use alcohol-based hand sanitizer. ? Change your dressing as  told by your health care provider. ? Place any used dressings or infusion bags into a plastic bag. Throw that bag in the trash. ? Keep the dressing that covers the needle clean and dry. Do not get it wet. ? Do not use scissors or sharp objects near the tube. ? Keep the tube clamped, unless it is being used.  Check your port site every day for signs of infection. Check for: ? Redness, swelling, or pain. ? Fluid or blood. ? Pus or a bad smell.  Protect the skin around the port site. ? Avoid wearing bra straps that rub or irritate the site. ? Protect the skin around your port from seat belts. Place a soft pad over your chest if needed.  Bathe or shower as told by your health care provider. The site may get wet as long as you are not actively receiving an infusion.  Return to your normal activities as told by your health care provider. Ask your health care provider what activities are safe for you.  Carry a medical alert card or wear a medical alert bracelet at all times. This will let health care providers know that you have an implanted port in case of an emergency. Get help right away if:  You have redness, swelling, or pain at the port site.  You have fluid or blood coming from your port site.  You have pus or a bad smell coming from the port site.  You have a fever. Summary  Implanted ports are usually placed in the chest for long-term IV access.  Follow instructions from your health care provider about flushing the port and changing bandages (dressings).  Take care of the area around your port by avoiding clothing that puts pressure on the area, and by watching for signs of infection.  Protect the skin around your port from seat belts. Place a soft pad over your chest if needed.  Get help right away if you have a fever or you have redness, swelling, pain, drainage, or a bad smell at the port site. This information is not intended to replace advice given to you by your health  care provider. Make sure you discuss any questions you have with your health care provider. Document Revised: 12/27/2018 Document Reviewed: 10/07/2016 Elsevier Patient Education  2020 [employer].

## 2020-04-15 NOTE — Progress Notes (Signed)
[facility]   HPI:  59 y.o. Adult presents to clinic for post-op follow-up evaluation after a lower anterior resection for colon cancer. He says he has been having regular BMs and eating well. No nausea. He says his pain had been ok with tylenol but he never got the Rx for ibuprofen. He reports some sharp and burning pain with BMs. He also describes some irritation at the inferior portion of his incision.   Review of Systems:  No drainage at incision Redness/ irritation inferior portion incision Painful BM No fevers All other review of systems: otherwise negative   Vital Signs:  BP (!) 134/78   Pulse 67   Temp 99 F (37.2 C) (Oral)   Resp 16   Ht 5\' 9"  (1.753 m)   SpO2 98%   BMI 35.32 kg/m    Physical Exam:  Physical Exam Vitals reviewed. Exam conducted with a chaperone present.  HENT:     Head: Normocephalic.     Nose: Nose normal.  Cardiovascular:     Rate and Rhythm: Normal rate.  Pulmonary:     Effort: Pulmonary effort is normal.  Abdominal:     General: There is no distension.     Palpations: Abdomen is soft.     Tenderness: There is abdominal tenderness.     Comments: Healing incision, staples removed, steri strips placed, minor irritation at the staple entry sites, some beefy red skin inferiorly, but not extending past the staples, no drainage, wound probed and no opening, area is at the belt line, looks like yeast?   Genitourinary:    Comments: No obvious fissure, some external hemorrhoidal tissue Skin:    General: Skin is warm.  Neurological:     Mental Status: He is alert.       Assessment:  59 y.o. yo male s/p lower anterior resection for colon cancer T3N0. Doing well overall.   Plan:  Diet as tolerated No heavy lifting > 10 lbs, excessive bending, pushing, pulling, or squatting for 6-8 weeks after surgery.  No signs of fissure right now but rectal lidocaine samples given to patient Keep the wound covered with dry gauze and  papertape. Keep area dry and notify if worsening redness or drainage from the inferior part of wound. Diflucan sent in for his wound.    Discussed possible need for port pending his conversation with Dr. Delton Coombes and his final pathology. Discussed risk of bleeding, infection, pneumothorax.   Future Appointments  Date Time Provider [place]  04/20/2020 11:45 AM Virl Cagey, MD [facility] None  04/20/2020  1:30 PM Derek Jack, MD [facility] None     Curlene Labrum, MD [facility] [address] [place], [place] [address] [phone] (office)

## 2020-04-19 ENCOUNTER — Ambulatory Visit (HOSPITAL_COMMUNITY): Admitting: Hematology

## 2020-04-19 ENCOUNTER — Encounter (HOSPITAL_COMMUNITY): Payer: Self-pay

## 2020-04-19 NOTE — Progress Notes (Signed)
Unable to contact patient for introductory phone call due to patient's current location. I will plan to meet with patient during initial visit with Dr. Delton Coombes.

## 2020-04-20 ENCOUNTER — Other Ambulatory Visit: Payer: Self-pay

## 2020-04-20 ENCOUNTER — Inpatient Hospital Stay (HOSPITAL_COMMUNITY): Attending: Hematology | Admitting: Hematology

## 2020-04-20 ENCOUNTER — Inpatient Hospital Stay (HOSPITAL_COMMUNITY)

## 2020-04-20 ENCOUNTER — Ambulatory Visit (INDEPENDENT_AMBULATORY_CARE_PROVIDER_SITE_OTHER): Payer: Self-pay | Admitting: General Surgery

## 2020-04-20 ENCOUNTER — Encounter (HOSPITAL_COMMUNITY): Payer: Self-pay | Admitting: Hematology

## 2020-04-20 VITALS — BP 110/68 | HR 114 | Temp 97.6°F | Resp 14 | Ht 69.0 in | Wt 226.0 lb

## 2020-04-20 VITALS — BP 129/70 | HR 62 | Temp 97.0°F | Resp 18 | Ht 69.0 in | Wt 226.8 lb

## 2020-04-20 DIAGNOSIS — I700 Atherosclerosis of aorta: Secondary | ICD-10-CM | POA: Insufficient documentation

## 2020-04-20 DIAGNOSIS — R5383 Other fatigue: Secondary | ICD-10-CM | POA: Diagnosis not present

## 2020-04-20 DIAGNOSIS — M47816 Spondylosis without myelopathy or radiculopathy, lumbar region: Secondary | ICD-10-CM | POA: Insufficient documentation

## 2020-04-20 DIAGNOSIS — G629 Polyneuropathy, unspecified: Secondary | ICD-10-CM | POA: Diagnosis not present

## 2020-04-20 DIAGNOSIS — R202 Paresthesia of skin: Secondary | ICD-10-CM | POA: Diagnosis not present

## 2020-04-20 DIAGNOSIS — R109 Unspecified abdominal pain: Secondary | ICD-10-CM | POA: Diagnosis not present

## 2020-04-20 DIAGNOSIS — K921 Melena: Secondary | ICD-10-CM | POA: Insufficient documentation

## 2020-04-20 DIAGNOSIS — R110 Nausea: Secondary | ICD-10-CM | POA: Diagnosis not present

## 2020-04-20 DIAGNOSIS — R3911 Hesitancy of micturition: Secondary | ICD-10-CM | POA: Insufficient documentation

## 2020-04-20 DIAGNOSIS — R200 Anesthesia of skin: Secondary | ICD-10-CM | POA: Diagnosis not present

## 2020-04-20 DIAGNOSIS — C187 Malignant neoplasm of sigmoid colon: Secondary | ICD-10-CM

## 2020-04-20 DIAGNOSIS — Z87891 Personal history of nicotine dependence: Secondary | ICD-10-CM | POA: Insufficient documentation

## 2020-04-20 DIAGNOSIS — F329 Major depressive disorder, single episode, unspecified: Secondary | ICD-10-CM | POA: Insufficient documentation

## 2020-04-20 DIAGNOSIS — Z79899 Other long term (current) drug therapy: Secondary | ICD-10-CM | POA: Diagnosis not present

## 2020-04-20 DIAGNOSIS — I252 Old myocardial infarction: Secondary | ICD-10-CM | POA: Diagnosis not present

## 2020-04-20 DIAGNOSIS — R519 Headache, unspecified: Secondary | ICD-10-CM | POA: Insufficient documentation

## 2020-04-20 DIAGNOSIS — R609 Edema, unspecified: Secondary | ICD-10-CM | POA: Diagnosis not present

## 2020-04-20 DIAGNOSIS — D125 Benign neoplasm of sigmoid colon: Secondary | ICD-10-CM | POA: Diagnosis not present

## 2020-04-20 DIAGNOSIS — M5136 Other intervertebral disc degeneration, lumbar region: Secondary | ICD-10-CM | POA: Diagnosis not present

## 2020-04-20 DIAGNOSIS — N400 Enlarged prostate without lower urinary tract symptoms: Secondary | ICD-10-CM | POA: Insufficient documentation

## 2020-04-20 DIAGNOSIS — E1142 Type 2 diabetes mellitus with diabetic polyneuropathy: Secondary | ICD-10-CM | POA: Diagnosis not present

## 2020-04-20 DIAGNOSIS — M7989 Other specified soft tissue disorders: Secondary | ICD-10-CM | POA: Diagnosis not present

## 2020-04-20 DIAGNOSIS — B372 Candidiasis of skin and nail: Secondary | ICD-10-CM

## 2020-04-20 MED ORDER — TAMSULOSIN HCL 0.4 MG PO CAPS: 0.4000 mg | ORAL_CAPSULE | Freq: Every day | ORAL | 6 refills | Status: AC

## 2020-04-20 MED ORDER — LIDOCAINE 5 % EX CREA: 1.0000 "application " | TOPICAL_CREAM | Freq: Four times a day (QID) | CUTANEOUS | 1 refills | Status: AC | PRN

## 2020-04-20 NOTE — Patient Instructions (Signed)
[facility] at [facility] Discharge Instructions  You were seen and examined today by Dr. Delton Coombes. Dr. Delton Coombes discussed your past medical history, family history and current functional status.   We will do lab work today and a CT chest and CT of your abdomen and pelvis. We will also do a molecular testing, which is a blood test that will help to drive treatment recommendations.  We will see you after these tests have resulted.   Thank you for choosing [place] at [facility] to provide your oncology and hematology care.  To afford each patient quality time with our provider, please arrive at least 15 minutes before your scheduled appointment time.   If you have a lab appointment with the [place] please come in thru the Main Entrance and check in at the main information desk.  You need to re-schedule your appointment should you arrive 10 or more minutes late.  We strive to give you quality time with our providers, and arriving late affects you and other patients whose appointments are after yours.  Also, if you no show three or more times for appointments you may be dismissed from the clinic at the providers discretion.     Again, thank you for choosing [facility].  Our hope is that these requests will decrease the amount of time that you wait before being seen by our physicians.       _____________________________________________________________  Should you have questions after your visit to [facility], please contact our office at [phone] and follow the prompts.  Our office hours are 8:00 a.m. and 4:30 p.m. Monday - Friday.  Please note that voicemails left after 4:00 p.m. may not be returned until the following business day.  We are closed weekends and major holidays.  You do have access to a nurse 24-7, just call the main number to the clinic [phone] and do not press any options, hold on the line  and a nurse will answer the phone.    For prescription refill requests, have your pharmacy contact our office and allow 72 hours.    Due to Covid, you will need to wear a mask upon entering the hospital. If you do not have a mask, a mask will be given to you at the Main Entrance upon arrival. For doctor visits, patients may have 1 support person age 27 or older with them. For treatment visits, patients can not have anyone with them due to social distancing guidelines and our immunocompromised population.

## 2020-04-20 NOTE — Patient Instructions (Addendum)
No heavy lifting > 10 lbs, excessive bending, pushing, pulling, or squatting for 6-8 weeks after surgery.   Rectal ointment as needed   Keep incision dry and covered as needed until steri strips fall off.

## 2020-04-20 NOTE — Progress Notes (Signed)
[facility] Note   HPI:  59 y.o. Adult presents to clinic for follow-up evaluation after partial colectomy for colon cancer. His wound is looking better. He did take the diflucan. He says that it feels better and shows less irritation.   Rectal pain with defecation improved with recticare samples.   Review of Systems:  No fever or chills Tolerating diet Having BMs Rectal pain improved with lidocaine  All other review of systems: otherwise negative   Vital Signs:  BP 110/68   Pulse (!) 114   Temp 97.6 F (36.4 C) (Oral)   Resp 14   Ht 5\' 9"  (1.753 m)   Wt 226 lb (102.5 kg)   SpO2 95%   BMI 33.37 kg/m    Physical Exam:  Physical Exam Vitals reviewed.  Cardiovascular:     Rate and Rhythm: Normal rate.  Pulmonary:     Effort: Pulmonary effort is normal.  Abdominal:     General: There is no distension.     Palpations: Abdomen is soft.     Tenderness: There is no abdominal tenderness.     Hernia: No hernia is present.     Comments: Midline healing, lower incision healing and no signs of infection, redness immediately around staple line improving, no drainage  Neurological:     Mental Status: He is alert.    Assessment:  59 y.o. yo Adult s/p lower anterior resection for colon cancer T3N0. Doing well. Previously no fissure just hemorrhoidal tissue on rectal exam.   Plan:  - Diet as tolerated  - No heavy lifting > 10 lbs, excessive bending, pushing, pulling, or squatting for 6-8 weeks after surgery.  - Will see Oncology today to determine need for chemotherapy treatment - Have already discussed port if needed  - Rx for Lidocaine 5% QID PRN to anal area as needed, if worsening pain or tear/ fissure will come back and see [place]   All of the above recommendations were discussed with the patient, and all of patient's questions were answered to his expressed satisfaction.  Curlene Labrum, MD [facility] [address] [place], [place] [address] [phone] (office)

## 2020-04-20 NOTE — Progress Notes (Signed)
[facility] [address], [place] [address]   CLINIC:  Medical Oncology/Hematology  CONSULT NOTE  Patient Care Team: Patient, No Pcp Per as PCP - General (General Practice) Rourk, [name], MD as Consulting Physician (Gastroenterology)  CHIEF COMPLAINTS/PURPOSE OF CONSULTATION:  Evaluation of colon cancer  HISTORY OF PRESENTING ILLNESS:  [name] 59 y.o. adult is here because of evaluation of colon cancer, at the request of Dr. Curlene Labrum.  Today he reports that he has been having hematochezia on and off since 02/14/2019 and a colonoscopy on 11/12/2019 showed colon cancer. This is his first bout with cancer.  He underwent surgical resection on 03/31/2020, which was delayed due to Covid.  He has recovered well from surgery.  He reports having numbness and tingling in the bottom of both feet, and it sometimes wakes him up at night. He denies any recent weight loss.  He also reported difficulty initiating urine stream.  He reports that both GF died from cancer; all other is unknown. He was in the [employer]. He quit smoking in 2007, smoking 2 PPD for 10 years.  MEDICAL HISTORY:  Past Medical History:  Diagnosis Date   Abscess of mouth    Arthritis    Cellulitis of mouth    Chalazion    Chronic rhinitis    Corns and callosities    Coronary artery disease    Cough    Functional dyspepsia    GERD (gastroesophageal reflux disease)    Heart disease    severe left ventricular systolic dysfunction after anterior MI   Hemoptysis    Hyperlipidemia    Hypertension    Joint pain    Presbyopia    STEMI (ST elevation myocardial infarction) ([place]) 2016   Tinea unguium    Toe pain    Vitamin D deficiency     SURGICAL HISTORY: Past Surgical History:  Procedure Laterality Date   BIOPSY  11/12/2019   Procedure: BIOPSY;  Surgeon: Daneil Dolin, MD;  [facility]: [facility];  Service: Endoscopy;;   COLONOSCOPY  N/A 11/12/2019   Procedure: COLONOSCOPY;  Surgeon: Daneil Dolin, MD;  [facility];  Service: Endoscopy;  Laterality: N/A;  2:00pm   CORONARY STENT PLACEMENT  2016   ESOPHAGOGASTRODUODENOSCOPY N/A 11/12/2019   Procedure: ESOPHAGOGASTRODUODENOSCOPY (EGD);  Surgeon: Daneil Dolin, MD;  [facility]: [facility];  Service: Endoscopy;  Laterality: N/A;   PARTIAL COLECTOMY N/A 03/31/2020   Procedure: PARTIAL COLECTOMY;  Surgeon: Virl Cagey, MD;  Location: [facility];  Service: General;  Laterality: N/A;  pt is an inmate, need covid test AM of procedure - pt to arrive at 6:30 - ok per [place]   POLYPECTOMY  11/12/2019   Procedure: POLYPECTOMY;  Surgeon: Daneil Dolin, MD;  [facility]: [facility];  Service: Endoscopy;;    SOCIAL HISTORY: Social History   Socioeconomic History   Marital status: Single    Spouse name: Not on file   Number of children: Not on file   Years of education: Not on file   Highest education level: Some college, no degree  Occupational History   Not on file  Tobacco Use   Smoking status: Former Smoker    Packs/day: 1.00    Years: 40.00    Pack years: 40.00    Types: Cigarettes    Quit date: 01/22/2006    Years since quitting: 14.2   Smokeless tobacco: Never Used  Vaping Use   Vaping Use: Never used  Substance and Sexual Activity   Alcohol use: Never   Drug use: Never   Sexual activity: Never  Other Topics Concern   Not on file  Social History Narrative   Not on file   Social Determinants of Health   Financial Resource Strain:    Difficulty of Paying Living Expenses:   Food Insecurity:    Worried About Charity fundraiser in the Last Year:    Arboriculturist in the Last Year:   Transportation Needs:    Film/video editor (Medical):    Lack of Transportation (Non-Medical):   Physical Activity:    Days of Exercise per Week:    Minutes of Exercise per Session:   Stress:    Feeling of Stress :     Social Connections:    Frequency of Communication with Friends and Family:    Frequency of Social Gatherings with Friends and Family:    Attends Religious Services:    Active Member of Clubs or Organizations:    Attends Music therapist:    Marital Status:   Intimate Partner Violence:    Fear of Current or Ex-Partner:    Emotionally Abused:    Physically Abused:    Sexually Abused:     FAMILY HISTORY: Family History  Problem Relation Age of Onset   Colon cancer Neg Hx    Colon polyps Neg Hx     ALLERGIES:  is allergic to kale, collard greens [lactuca virosa], and other.  MEDICATIONS:  Current Outpatient Medications  Medication Sig Dispense Refill   acetaminophen (TYLENOL) 650 MG CR tablet Take 2 tablets (1,300 mg total) by mouth 3 (three) times daily. 180 tablet 0   aspirin EC 81 MG tablet Take 81 mg by mouth daily. Swallow whole.     atorvastatin (LIPITOR) 80 MG tablet Take 80 mg by mouth daily.     docusate sodium (COLACE) 100 MG capsule Take 100 mg by mouth 2 (two) times daily.     ferrous sulfate 325 (65 FE) MG tablet 1 tablet twice a day with meals 60 tablet 3   fluconazole (DIFLUCAN) 50 MG tablet Take 3 tablets (150 mg total) by mouth daily. 1 tablet 0   furosemide (LASIX) 20 MG tablet Take 20 mg by mouth daily.      ibuprofen (ADVIL) 800 MG tablet Take 1 tablet (800 mg total) by mouth every 8 (eight) hours as needed for mild pain or moderate pain. Do not take on an empty stomach. 30 tablet 0   isosorbide mononitrate (IMDUR) 30 MG 24 hr tablet Take 30 mg by mouth daily.     Lidocaine 5 % CREA Apply 1 application topically 4 (four) times daily as needed (to anal region). 30 g 1   metoprolol succinate (TOPROL-XL) 25 MG 24 hr tablet Take 25 mg by mouth daily.     nitroGLYCERIN (NITROSTAT) 0.4 MG SL tablet Place 0.4 mg under the tongue every 5 (five) minutes x 3 doses as needed for chest pain.      omeprazole (PRILOSEC) 20 MG capsule  Take 1 capsule (20 mg total) by mouth 2 (two) times daily before a meal. 60 capsule 1   Sodium Phosphates (TGT SALINE LAXATIVE) ENEM Place 1 Dose rectally once.      spironolactone (ALDACTONE) 25 MG tablet Take 25 mg by mouth daily.     No current facility-administered medications for this visit.    REVIEW  OF SYSTEMS:   Review of Systems  Constitutional: Positive for appetite change (moderately decreased) and fatigue (mild).  HENT:   Positive for trouble swallowing (difficulty chewing).   Gastrointestinal: Positive for abdominal pain (lower abdominal pain).  Genitourinary: Positive for difficulty urinating (weak stream, incomplete emptying).   Neurological: Positive for numbness (& tingling bottom of feet).  All other systems reviewed and are negative.    PHYSICAL EXAMINATION: ECOG PERFORMANCE STATUS: 1 - Symptomatic but completely ambulatory  Vitals:   04/20/20 1250  BP: 129/70  Pulse: 62  Resp: 18  Temp: (!) 97 F (36.1 C)  SpO2: 99%   Filed Weights   04/20/20 1250  Weight: 226 lb 12.8 oz (102.9 kg)   Physical Exam Vitals reviewed.  Constitutional:      Appearance: Normal appearance. He is obese.  Cardiovascular:     Rate and Rhythm: Normal rate and regular rhythm.     Pulses: Normal pulses.     Heart sounds: Normal heart sounds.  Pulmonary:     Effort: Pulmonary effort is normal.     Breath sounds: Normal breath sounds.  Abdominal:     Palpations: Abdomen is soft. There is no hepatomegaly, splenomegaly or mass.     Tenderness: There is no abdominal tenderness.     Hernia: No hernia is present.     Comments: Hypogastric midline incision healing well  Musculoskeletal:     Right lower leg: Edema (1+) present.     Left lower leg: Edema (1+) present.  Lymphadenopathy:     Upper Body:     Right upper body: No supraclavicular or axillary adenopathy.     Left upper body: No supraclavicular or axillary adenopathy.     Lower Body: No right inguinal adenopathy. No  left inguinal adenopathy.  Neurological:     General: No focal deficit present.     Mental Status: He is alert and oriented to person, place, and time.  Psychiatric:        Mood and Affect: Mood normal.        Behavior: Behavior normal.      LABORATORY DATA:  I have reviewed the data as listed CBC Latest Ref Rng & Units 04/03/2020 04/02/2020 04/01/2020  WBC 4.0 - 10.5 K/uL 7.7 6.7 10.8(H)  Hemoglobin 13.0 - 17.0 g/dL 7.9(L) 8.0(L) 7.6(L)  Hematocrit 39 - 52 % 29.5(L) 29.1(L) 27.7(L)  Platelets 150 - 400 K/uL 327 292 310   CMP Latest Ref Rng & Units 04/03/2020 04/02/2020 04/01/2020  Glucose 70 - 99 mg/dL 138(H) 108(H) 147(H)  BUN 8 - 23 mg/dL 5(L) <5(L) 6(L)  Creatinine 0.61 - 1.24 mg/dL 0.83 0.88 0.79  Sodium 135 - 145 mmol/L 139 140 137  Potassium 3.5 - 5.1 mmol/L 3.5 3.9 3.9  Chloride 98 - 111 mmol/L 104 107 106  CO2 22 - 32 mmol/L _0 Calcium 8.9 - 10.3 mg/dL 8.7(L) 8.4(L) 8.4(L)  Total Protein 6.5 - 8.1 g/dL - - -  Total Bilirubin 0.3 - 1.2 mg/dL - - -  Alkaline Phos 38 - 126 U/L - - -  AST 15 - 41 U/L - - -  ALT 0 - 44 U/L - - -    RADIOGRAPHIC STUDIES: I have personally reviewed the radiological images as listed and agreed with the findings in the report.  ASSESSMENT:  1.  T3N0 sigmoid colon adenocarcinoma: -Presentation with on and off rectal bleeding since May 2020. -CT abdomen and pelvis on 02/15/2019 showed wall thickening in the  mid sigmoid colon. -Colonoscopy on 11/12/2019 shows a mass in the sigmoid region from 20 cm from anal verge to 28 cm from anal verge, three 4 to 5 mm polyps in the proximal rectum.  Biopsy of the mass consistent with invasive adenocarcinoma.  Polyps were hyperplastic.  EGD at the same time was normal. -Sigmoid colon resection on 03/31/2020 shows moderately differentiated invasive adenocarcinoma, margins negative, 0/19 lymph nodes positive, PT3 PN0.  Negative for lymphovascular and perineural invasion.  No perforation.  MMR is  preserved.  2.  Family history: -Paternal grandfather and maternal grandfather, both died of cancer.  Patient does not know the type.   PLAN:  1.  Stage II (T3N0) sigmoid colon adenocarcinoma: -His abdominal wound is well-healed.  We talked about the pathology report and normal prognosis of stage II colon cancer in detail. -I have recommended CEA level.  We will also check staging CT scan of the chest, abdomen and pelvis. -I have also recommended guardant reveal to look for circulating tumor cell DNA. -I will see him back after the scans and blood work.  2.  Urinary hesitancy: -He reports urinary hesitancy and difficulty urination.  We will start him on Flomax 0.4 mg at bedtime.  3.  Peripheral neuropathy: -He has some numbness in the bottom of the feet along with tingling from underlying diabetes.   All questions were answered. The patient knows to call the clinic with any problems, questions or concerns.  Derek Jack, MD, 04/20/20 1:14 PM  [place] [phone]   I, Milinda Antis, am acting as a scribe for Dr. Sanda Linger.  I, Derek Jack MD, have reviewed the above documentation for accuracy and completeness, and I agree with the above.

## 2020-04-21 LAB — CEA: CEA: 1.9 ng/mL (ref 0.0–4.7)

## 2020-05-05 ENCOUNTER — Encounter (HOSPITAL_COMMUNITY): Payer: Self-pay

## 2020-05-05 NOTE — Progress Notes (Signed)
Guardant Reveal results have been placed in Oncology History.

## 2020-05-06 ENCOUNTER — Ambulatory Visit (HOSPITAL_COMMUNITY)
Admission: RE | Admit: 2020-05-06 | Discharge: 2020-05-06 | Disposition: A | Discharge status: Home / Self Care | Source: Ambulatory Visit | Attending: Hematology | Admitting: Hematology

## 2020-05-06 ENCOUNTER — Other Ambulatory Visit: Payer: Self-pay

## 2020-05-06 DIAGNOSIS — C187 Malignant neoplasm of sigmoid colon: Secondary | ICD-10-CM | POA: Insufficient documentation

## 2020-05-06 LAB — POCT I-STAT CREATININE: Creatinine, Ser: 0.9 mg/dL (ref 0.61–1.24)

## 2020-05-06 MED ORDER — IOHEXOL 300 MG/ML  SOLN
100.0000 mL | Freq: Once | INTRAMUSCULAR | Status: AC | PRN
  Administered 2020-05-06: 100 mL via INTRAVENOUS

## 2020-05-10 ENCOUNTER — Inpatient Hospital Stay (HOSPITAL_BASED_OUTPATIENT_CLINIC_OR_DEPARTMENT_OTHER): Admitting: Hematology

## 2020-05-10 ENCOUNTER — Other Ambulatory Visit: Payer: Self-pay

## 2020-05-10 ENCOUNTER — Encounter (HOSPITAL_COMMUNITY): Payer: Self-pay | Admitting: Hematology

## 2020-05-10 VITALS — BP 131/82 | HR 62 | Temp 96.8°F | Wt 228.1 lb

## 2020-05-10 DIAGNOSIS — C187 Malignant neoplasm of sigmoid colon: Secondary | ICD-10-CM

## 2020-05-10 MED ORDER — GABAPENTIN 300 MG PO CAPS: 300.0000 mg | ORAL_CAPSULE | Freq: Three times a day (TID) | ORAL | 3 refills | Status: AC

## 2020-05-10 MED ORDER — GABAPENTIN 300 MG PO CAPS
300.0000 mg | ORAL_CAPSULE | Freq: Three times a day (TID) | ORAL | 3 refills | Status: DC
Start: 2020-05-10 — End: 2020-05-10

## 2020-05-10 NOTE — Patient Instructions (Addendum)
[facility] at [facility] Discharge Instructions  You were seen today by Dr. Katragadda. He went over your recent results and scans. Dr. Katragadda will see you back in 3 months for labs and follow up.   Thank you for choosing [facility] at [facility] to provide your oncology and hematology care.  To afford each patient quality time with our provider, please arrive at least 15 minutes before your scheduled appointment time.   If you have a lab appointment with the Cancer Center please come in thru the Main Entrance and check in at the main information desk  You need to re-schedule your appointment should you arrive 10 or more minutes late.  We strive to give you quality time with our providers, and arriving late affects you and other patients whose appointments are after yours.  Also, if you no show three or more times for appointments you may be dismissed from the clinic at the providers discretion.     Again, thank you for choosing [facility].  Our hope is that these requests will decrease the amount of time that you wait before being seen by our physicians.       _____________________________________________________________  Should you have questions after your visit to [facility], please contact our office at [phone] between the hours of 8:00 a.m. and 4:30 p.m.  Voicemails left after 4:00 p.m. will not be returned until the following business day.  For prescription refill requests, have your pharmacy contact our office and allow 72 hours.    Cancer Center Support Programs:   > Cancer Support Group  2nd Tuesday of the month 1pm-2pm, Journey Room    

## 2020-05-10 NOTE — Progress Notes (Signed)
[place],  [address]   CLINIC:  Medical Oncology/Hematology  PCP:  Patient, No Pcp Per None None   REASON FOR VISIT:  Follow-up for sigmoid colon cancer  PRIOR THERAPY: Sigmoid colon resection on 03/31/2020  NGS Results: Not done  CURRENT THERAPY: Under work-up  BRIEF ONCOLOGIC HISTORY:  Oncology History  Malignant neoplasm of sigmoid colon ([place])  01/27/2020 Initial Diagnosis   Malignant neoplasm of sigmoid colon ([place])    Genetic Testing   Guardant Reveal Results:       CANCER STAGING: Cancer Staging No matching staging information was found for the patient.  INTERVAL HISTORY:  [name], a 59 y.o. adult, returns for routine follow-up of his sigmoid colon cancer. Walter Allen was last seen on 04/20/2020.  Today he reports feeling okay. He is tolerating the Flomax well and his stream has improved. He denies hematochezia or hematuria, but endorses having melena since he is taking the iron tablets. He reports having numbness and shooting pains in his feet bilaterally, which is tolerable.   REVIEW OF SYSTEMS:  Review of Systems  Constitutional: Positive for appetite change (mildly decreased) and fatigue (severe).  Cardiovascular: Positive for leg swelling (ankle swelling).  Gastrointestinal: Positive for nausea (intermittent). Negative for blood in stool.  Genitourinary: Negative for hematuria.   Neurological: Positive for headaches and numbness (+ tingling in bilat feet).  Psychiatric/Behavioral: Positive for depression. The patient is nervous/anxious.   All other systems reviewed and are negative.   PAST MEDICAL/SURGICAL HISTORY:  Past Medical History:  Diagnosis Date  . Abscess of mouth   . Arthritis   . Cellulitis of mouth   . Chalazion   . Chronic rhinitis   . Corns and callosities   . Coronary artery disease   . Cough   . Functional dyspepsia   . GERD (gastroesophageal reflux disease)   . Heart  disease    severe left ventricular systolic dysfunction after anterior MI  . Hemoptysis   . Hyperlipidemia   . Hypertension   . Joint pain   . Presbyopia   . STEMI (ST elevation myocardial infarction) ([place]) 2016  . Tinea unguium   . Toe pain   . Vitamin D deficiency    Past Surgical History:  Procedure Laterality Date  . BIOPSY  11/12/2019   Procedure: BIOPSY;  Surgeon: Daneil Dolin, MD;  Location: [facility];  Service: Endoscopy;;  . COLONOSCOPY N/A 11/12/2019   Procedure: COLONOSCOPY;  Surgeon: Daneil Dolin, MD;  Location: [facility];  Service: Endoscopy;  Laterality: N/A;  2:00pm  . CORONARY STENT PLACEMENT  2016  . ESOPHAGOGASTRODUODENOSCOPY N/A 11/12/2019   Procedure: ESOPHAGOGASTRODUODENOSCOPY (EGD);  Surgeon: Daneil Dolin, MD;  [facility]: [facility];  Service: Endoscopy;  Laterality: N/A;  . PARTIAL COLECTOMY N/A 03/31/2020   Procedure: PARTIAL COLECTOMY;  Surgeon: Virl Cagey, MD;  Location: [facility];  Service: General;  Laterality: N/A;  pt is an inmate, need covid test AM of procedure - pt to arrive at 6:30 - ok per [facility]  . POLYPECTOMY  11/12/2019   Procedure: POLYPECTOMY;  Surgeon: Daneil Dolin, MD;  [facility];  Service: Endoscopy;;    SOCIAL HISTORY:  Social History   Socioeconomic History  . Marital status: Single    Spouse name: Not on file  . Number of children: 0  . Years of education: Not on file  . Highest education level: Some college, no  degree  Occupational History  . Not on file  Tobacco Use  . Smoking status: Former Smoker    Packs/day: 1.00    Years: 40.00    Pack years: 40.00    Types: Cigarettes    Quit date: 01/22/2006    Years since quitting: 14.3  . Smokeless tobacco: Never Used  Vaping Use  . Vaping Use: Never used  Substance and Sexual Activity  . Alcohol use: Never  . Drug use: Never  . Sexual activity: Not Currently  Other Topics Concern  . Not on file  Social History Narrative  . Not  on file   Social Determinants of Health   Financial Resource Strain: Low Risk   . Difficulty of Paying Living Expenses: Not very hard  Food Insecurity: No Food Insecurity  . Worried About Charity fundraiser in the Last Year: Never true  . Ran Out of Food in the Last Year: Never true  Transportation Needs: No Transportation Needs  . Lack of Transportation (Medical): No  . Lack of Transportation (Non-Medical): No  Physical Activity: Inactive  . Days of Exercise per Week: 0 days  . Minutes of Exercise per Session: 0 min  Stress: Stress Concern Present  . Feeling of Stress : To some extent  Social Connections: Socially Isolated  . Frequency of Communication with Friends and Family: Never  . Frequency of Social Gatherings with Friends and Family: Never  . Attends Religious Services: Never  . Active Member of Clubs or Organizations: No  . Attends Archivist Meetings: Never  . Marital Status: Never married  Intimate Partner Violence: Not At Risk  . Fear of Current or Ex-Partner: No  . Emotionally Abused: No  . Physically Abused: No  . Sexually Abused: No    FAMILY HISTORY:  Family History  Problem Relation Age of Onset  . Colon cancer Neg Hx   . Colon polyps Neg Hx     CURRENT MEDICATIONS:  Current Outpatient Medications  Medication Sig Dispense Refill  . aspirin EC 81 MG tablet Take 81 mg by mouth daily. Swallow whole.    Marland Kitchen atorvastatin (LIPITOR) 80 MG tablet Take 80 mg by mouth daily.    Marland Kitchen docusate sodium (COLACE) 100 MG capsule Take 100 mg by mouth 2 (two) times daily.    . ferrous sulfate 325 (65 FE) MG tablet 1 tablet twice a day with meals 60 tablet 3  . furosemide (LASIX) 20 MG tablet Take 20 mg by mouth daily.     . isosorbide mononitrate (IMDUR) 30 MG 24 hr tablet Take 30 mg by mouth daily.    . Lidocaine 5 % CREA Apply 1 application topically 4 (four) times daily as needed (to anal region). 30 g 1  . metoprolol succinate (TOPROL-XL) 25 MG 24 hr tablet  Take 25 mg by mouth daily.    . nitroGLYCERIN (NITROSTAT) 0.4 MG SL tablet Place 0.4 mg under the tongue every 5 (five) minutes x 3 doses as needed for chest pain.  (Patient not taking: Reported on 04/20/2020)    . omeprazole (PRILOSEC) 20 MG capsule Take 1 capsule (20 mg total) by mouth 2 (two) times daily before a meal. 60 capsule 1  . spironolactone (ALDACTONE) 25 MG tablet Take 25 mg by mouth daily.    . tamsulosin (FLOMAX) 0.4 MG CAPS capsule Take 1 capsule (0.4 mg total) by mouth at bedtime. 30 capsule 6   No current facility-administered medications for this visit.    ALLERGIES:  Allergies  Allergen Reactions  . Kale Nausea And Vomiting  . Collard Greens [Lactuca Virosa]   . Other Nausea And Vomiting    Mustard greens, collards, kale, turnip greens, plack eyed peas    PHYSICAL EXAM:  Performance status (ECOG): 1 - Symptomatic but completely ambulatory  Vitals:   05/10/20 1313  BP: 131/82  Pulse: 62  Temp: (!) 96.8 F (36 C)  SpO2: 98%   Wt Readings from Last 3 Encounters:  05/10/20 228 lb 1.6 oz (103.5 kg)  04/20/20 226 lb 12.8 oz (102.9 kg)  04/20/20 226 lb (102.5 kg)   Physical Exam Vitals reviewed.  Constitutional:      Appearance: Normal appearance. He is obese.  Neurological:     General: No focal deficit present.     Mental Status: He is alert and oriented to person, place, and time.  Psychiatric:        Mood and Affect: Mood normal.        Behavior: Behavior normal.      LABORATORY DATA:  I have reviewed the labs as listed.  CBC Latest Ref Rng & Units 04/03/2020 04/02/2020 04/01/2020  WBC 4.0 - 10.5 K/uL 7.7 6.7 10.8(H)  Hemoglobin 13.0 - 17.0 g/dL 7.9(L) 8.0(L) 7.6(L)  Hematocrit 39 - 52 % 29.5(L) 29.1(L) 27.7(L)  Platelets 150 - 400 K/uL 327 292 310   CMP Latest Ref Rng & Units 05/06/2020 04/03/2020 04/02/2020  Glucose 70 - 99 mg/dL - 138(H) 108(H)  BUN 8 - 23 mg/dL - 5(L) <5(L)  Creatinine 0.61 - 1.24 mg/dL 0.90 0.83 0.88  Sodium 135 - 145 mmol/L -  139 140  Potassium 3.5 - 5.1 mmol/L - 3.5 3.9  Chloride 98 - 111 mmol/L - 104 107  CO2 22 - 32 mmol/L - 27 25  Calcium 8.9 - 10.3 mg/dL - 8.7(L) 8.4(L)  Total Protein 6.5 - 8.1 g/dL - - -  Total Bilirubin 0.3 - 1.2 mg/dL - - -  Alkaline Phos 38 - 126 U/L - - -  AST 15 - 41 U/L - - -  ALT 0 - 44 U/L - - -   Lab Results  Component Value Date   CEA1 1.9 04/20/2020    DIAGNOSTIC IMAGING:  I have independently reviewed the scans and discussed with the patient. CT Chest W Contrast  Result Date: 05/06/2020 CLINICAL DATA:  Partial colectomy for colon cancer on 03/31/2020, restaging assessment EXAM: CT CHEST, ABDOMEN, AND PELVIS WITH CONTRAST TECHNIQUE: Multidetector CT imaging of the chest, abdomen and pelvis was performed following the standard protocol during bolus administration of intravenous contrast. CONTRAST:  163m OMNIPAQUE IOHEXOL 300 MG/ML  SOLN COMPARISON:  02/15/2019 CT scan FINDINGS: CT CHEST FINDINGS Cardiovascular: Coronary, aortic arch, and branch vessel atherosclerotic vascular disease. Mediastinum/Nodes: Right hilar lymph node 0.7 cm in short axis on image 86/4. No pathologic adenopathy. Lungs/Pleura: Calcified granuloma in the right upper lobe on image 41/3 measuring 0.5 cm in long axis. 3 mm calcified granuloma in the right upper lobe anteriorly on image 62/3. 2 mm calcified granuloma in the lingula on image 100/3. Musculoskeletal: Lower thoracic spondylosis. CT ABDOMEN PELVIS FINDINGS Hepatobiliary: Unremarkable. No findings of metastatic disease to the liver. The gallbladder appears unremarkable. Pancreas: Unremarkable Spleen: Unremarkable Adrenals/Urinary Tract: Unremarkable Stomach/Bowel: The segment of sigmoid colon previously containing the mass has been resected. Wall thickening of the proximal sigmoid colon leading into the anastomosis with surrounding inflammatory stranding, much of which may simply represent expected postoperative appearance. I do not see extraluminal  fluid  collection or extraluminal gas to suggest abscess or breakdown. Vascular/Lymphatic: Aortoiliac atherosclerotic vascular disease. Ileocolic node 1.0 cm in short axis on image 86/2, previously 1.1 cm, merit surveillance. No new or overtly pathologic adenopathy identified. Reproductive: Mild prostatomegaly. Other: No supplemental non-categorized findings. Musculoskeletal: Bridging spurring of both sacroiliac joints superiorly. Lower lumbar spondylosis and degenerative disc disease, suspected mild deviation of the right L3 nerve in the lateral extraforaminal space due to intervertebral spurring. IMPRESSION: 1. Interval resection of the segment of sigmoid colon previously containing the mass. Wall thickening of the proximal sigmoid colon leading into the anastomosis with surrounding inflammatory stranding, much of which may simply represent expected postoperative appearance. No extraluminal fluid collection or extraluminal gas to suggest abscess or breakdown. 2. Other imaging findings of potential clinical significance: Coronary atherosclerosis. Old granulomatous disease. Mild prostatomegaly. Lower lumbar spondylosis and degenerative disc disease, suspected mild deviation of the right L3 nerve in the lateral extraforaminal space due to intervertebral spurring. 3. Aortic atherosclerosis. Aortic Atherosclerosis (ICD10-I70.0). Electronically Signed   By: Van Clines M.D.   On: 05/06/2020 15:47   CT Abdomen Pelvis W Contrast  Result Date: 05/06/2020 CLINICAL DATA:  Partial colectomy for colon cancer on 03/31/2020, restaging assessment EXAM: CT CHEST, ABDOMEN, AND PELVIS WITH CONTRAST TECHNIQUE: Multidetector CT imaging of the chest, abdomen and pelvis was performed following the standard protocol during bolus administration of intravenous contrast. CONTRAST:  125m OMNIPAQUE IOHEXOL 300 MG/ML  SOLN COMPARISON:  02/15/2019 CT scan FINDINGS: CT CHEST FINDINGS Cardiovascular: Coronary, aortic arch, and branch vessel  atherosclerotic vascular disease. Mediastinum/Nodes: Right hilar lymph node 0.7 cm in short axis on image 86/4. No pathologic adenopathy. Lungs/Pleura: Calcified granuloma in the right upper lobe on image 41/3 measuring 0.5 cm in long axis. 3 mm calcified granuloma in the right upper lobe anteriorly on image 62/3. 2 mm calcified granuloma in the lingula on image 100/3. Musculoskeletal: Lower thoracic spondylosis. CT ABDOMEN PELVIS FINDINGS Hepatobiliary: Unremarkable. No findings of metastatic disease to the liver. The gallbladder appears unremarkable. Pancreas: Unremarkable Spleen: Unremarkable Adrenals/Urinary Tract: Unremarkable Stomach/Bowel: The segment of sigmoid colon previously containing the mass has been resected. Wall thickening of the proximal sigmoid colon leading into the anastomosis with surrounding inflammatory stranding, much of which may simply represent expected postoperative appearance. I do not see extraluminal fluid collection or extraluminal gas to suggest abscess or breakdown. Vascular/Lymphatic: Aortoiliac atherosclerotic vascular disease. Ileocolic node 1.0 cm in short axis on image 86/2, previously 1.1 cm, merit surveillance. No new or overtly pathologic adenopathy identified. Reproductive: Mild prostatomegaly. Other: No supplemental non-categorized findings. Musculoskeletal: Bridging spurring of both sacroiliac joints superiorly. Lower lumbar spondylosis and degenerative disc disease, suspected mild deviation of the right L3 nerve in the lateral extraforaminal space due to intervertebral spurring. IMPRESSION: 1. Interval resection of the segment of sigmoid colon previously containing the mass. Wall thickening of the proximal sigmoid colon leading into the anastomosis with surrounding inflammatory stranding, much of which may simply represent expected postoperative appearance. No extraluminal fluid collection or extraluminal gas to suggest abscess or breakdown. 2. Other imaging findings of  potential clinical significance: Coronary atherosclerosis. Old granulomatous disease. Mild prostatomegaly. Lower lumbar spondylosis and degenerative disc disease, suspected mild deviation of the right L3 nerve in the lateral extraforaminal space due to intervertebral spurring. 3. Aortic atherosclerosis. Aortic Atherosclerosis (ICD10-I70.0). Electronically Signed   By: WVan ClinesM.D.   On: 05/06/2020 15:47     ASSESSMENT:  1.  T3N0 sigmoid colon adenocarcinoma: -Presentation with on and off  rectal bleeding since May 2020. -CT abdomen and pelvis on 02/15/2019 showed wall thickening in the mid sigmoid colon. -Colonoscopy on 11/12/2019 shows a mass in the sigmoid region from 20 cm from anal verge to 28 cm from anal verge, three 4 to 5 mm polyps in the proximal rectum.  Biopsy of the mass consistent with invasive adenocarcinoma.  Polyps were hyperplastic.  EGD at the same time was normal. -Sigmoid colon resection on 03/31/2020 shows moderately differentiated invasive adenocarcinoma, margins negative, 0/19 lymph nodes positive, PT3 PN0.  Negative for lymphovascular and perineural invasion.  No perforation.  MMR is preserved.  2.  Family history: -Paternal grandfather and maternal grandfather, both died of cancer.  Patient does not know the type.   PLAN:  1.  Stage II (T3N0) sigmoid colon adenocarcinoma: -Guardant reveal test did not show any evidence of cell free DNA. -CT [facility] on May 06, 2020 shows wall thickening of the proximal sigmoid colon leading into the anastomosis.  No evidence of metastatic disease. -CEA was 1.9. -I have recommended follow-up in 3 months with repeat labs and CEA.  2.  Urinary hesitancy: -He reported hesitancy and difficulty urination. -We started Flomax 0.4 mg at bedtime at last visit which helped.  3.  Peripheral neuropathy: -He reports pins-and-needles sensation in the bottom of the feet from diabetes. -We will start him on gabapentin 300 mg 3 times a day.    Orders placed this encounter:  Orders Placed This Encounter  Procedures  . CBC with Differential  . Comprehensive metabolic panel  . CEA  . Iron and TIBC  . Ferritin     Derek Jack, MD [place] [phone]   I, Milinda Antis, am acting as a scribe for Dr. Sanda Linger.  I, Derek Jack MD, have reviewed the above documentation for accuracy and completeness, and I agree with the above.

## 2020-08-02 ENCOUNTER — Other Ambulatory Visit (HOSPITAL_COMMUNITY)

## 2020-08-09 ENCOUNTER — Inpatient Hospital Stay (HOSPITAL_COMMUNITY): Attending: Hematology | Admitting: Hematology

## 2020-08-09 ENCOUNTER — Other Ambulatory Visit: Payer: Self-pay

## 2020-08-09 VITALS — BP 127/87 | HR 102 | Temp 97.1°F | Resp 18 | Wt 238.0 lb

## 2020-08-09 DIAGNOSIS — R079 Chest pain, unspecified: Secondary | ICD-10-CM | POA: Diagnosis not present

## 2020-08-09 DIAGNOSIS — R109 Unspecified abdominal pain: Secondary | ICD-10-CM | POA: Diagnosis not present

## 2020-08-09 DIAGNOSIS — R42 Dizziness and giddiness: Secondary | ICD-10-CM | POA: Insufficient documentation

## 2020-08-09 DIAGNOSIS — Z79899 Other long term (current) drug therapy: Secondary | ICD-10-CM | POA: Insufficient documentation

## 2020-08-09 DIAGNOSIS — R519 Headache, unspecified: Secondary | ICD-10-CM | POA: Insufficient documentation

## 2020-08-09 DIAGNOSIS — F17210 Nicotine dependence, cigarettes, uncomplicated: Secondary | ICD-10-CM | POA: Insufficient documentation

## 2020-08-09 DIAGNOSIS — C187 Malignant neoplasm of sigmoid colon: Secondary | ICD-10-CM | POA: Diagnosis not present

## 2020-08-09 DIAGNOSIS — E669 Obesity, unspecified: Secondary | ICD-10-CM | POA: Diagnosis not present

## 2020-08-09 DIAGNOSIS — R5383 Other fatigue: Secondary | ICD-10-CM | POA: Insufficient documentation

## 2020-08-09 DIAGNOSIS — G629 Polyneuropathy, unspecified: Secondary | ICD-10-CM | POA: Insufficient documentation

## 2020-08-09 DIAGNOSIS — F32A Depression, unspecified: Secondary | ICD-10-CM | POA: Diagnosis not present

## 2020-08-09 DIAGNOSIS — R3911 Hesitancy of micturition: Secondary | ICD-10-CM | POA: Insufficient documentation

## 2020-08-09 NOTE — Progress Notes (Signed)
[address], [place] [address]   CLINIC:  Medical Oncology/Hematology  PCP:  Patient, No Pcp Per None None   REASON FOR VISIT:  Follow-up for sigmoid colon cancer  PRIOR THERAPY: Sigmoid colon resection on 03/31/2020  NGS Results: Guardant Reveal ctDNA not detected  CURRENT THERAPY: Observation  BRIEF ONCOLOGIC HISTORY:  Oncology History  Malignant neoplasm of sigmoid colon ([place])  01/27/2020 Initial Diagnosis   Malignant neoplasm of sigmoid colon ([place])    Genetic Testing   Guardant Reveal Results:       CANCER STAGING: Cancer Staging No matching staging information was found for the patient.  INTERVAL HISTORY:  [name], a 59 y.o. adult, returns for routine follow-up of his sigmoid colon cancer. [place] was last seen on 05/10/2020.   Today he reports feeling okay. He complains of having itching and pain over his abdominal incision. He is not currently taking iron tablets. He is taking Flomax and is tolerating it well.  Denies any bleeding per rectum or melena.   REVIEW OF SYSTEMS:  Review of Systems  Constitutional: Positive for fatigue (50%). Negative for appetite change.  HENT:   Positive for trouble swallowing (chewing issues d/t teeth).   Cardiovascular: Positive for chest pain (over Sat and Sun).  Gastrointestinal: Positive for abdominal pain (7/10 at scar on abdomen).  Neurological: Positive for dizziness and headaches.  Psychiatric/Behavioral: Positive for depression. The patient is nervous/anxious.   All other systems reviewed and are negative.   PAST MEDICAL/SURGICAL HISTORY:  Past Medical History:  Diagnosis Date   Abscess of mouth    Arthritis    Cellulitis of mouth    Chalazion    Chronic rhinitis    Corns and callosities    Coronary artery disease    Cough    Functional dyspepsia    GERD (gastroesophageal reflux disease)    Heart disease    severe left ventricular systolic  dysfunction after anterior MI   Hemoptysis    Hyperlipidemia    Hypertension    Joint pain    Presbyopia    STEMI (ST elevation myocardial infarction) ([place]) 2016   Tinea unguium    Toe pain    Vitamin D deficiency    Past Surgical History:  Procedure Laterality Date   BIOPSY  11/12/2019   Procedure: BIOPSY;  Surgeon: Daneil Dolin, MD;  [facility];  Service: Endoscopy;;   COLONOSCOPY N/A 11/12/2019   Procedure: COLONOSCOPY;  Surgeon: Daneil Dolin, MD;  [facility];  Service: Endoscopy;  Laterality: N/A;  2:00pm   CORONARY STENT PLACEMENT  2016   ESOPHAGOGASTRODUODENOSCOPY N/A 11/12/2019   Procedure: ESOPHAGOGASTRODUODENOSCOPY (EGD);  Surgeon: Daneil Dolin, MD;  [facility];  Service: Endoscopy;  Laterality: N/A;   PARTIAL COLECTOMY N/A 03/31/2020   Procedure: PARTIAL COLECTOMY;  Surgeon: Virl Cagey, MD;  Location: [facility];  Service: General;  Laterality: N/A;  pt is an inmate, need covid test AM of procedure - pt to arrive at 6:30 - ok per [place]   POLYPECTOMY  11/12/2019   Procedure: POLYPECTOMY;  Surgeon: Daneil Dolin, MD;  [facility];  Service: Endoscopy;;    SOCIAL HISTORY:  Social History   Socioeconomic History   Marital status: Single    Spouse name: Not on file   Number of children: 0   Years of education: Not on file   Highest education level: Some college, no  degree  Occupational History   Not on file  Tobacco Use   Smoking status: Former Smoker    Packs/day: 1.00    Years: 40.00    Pack years: 40.00    Types: Cigarettes    Quit date: 01/22/2006    Years since quitting: 14.5   Smokeless tobacco: Never Used  Vaping Use   Vaping Use: Never used  Substance and Sexual Activity   Alcohol use: Never   Drug use: Never   Sexual activity: Not Currently  Other Topics Concern   Not on file  Social History Narrative   Not on file   Social Determinants of Health    Financial Resource Strain: Low Risk    Difficulty of Paying Living Expenses: Not very hard  Food Insecurity: No Food Insecurity   Worried About Charity fundraiser in the Last Year: Never true   [place] in the Last Year: Never true  Transportation Needs: No Transportation Needs   Lack of Transportation (Medical): No   Lack of Transportation (Non-Medical): No  Physical Activity: Inactive   Days of Exercise per Week: 0 days   Minutes of Exercise per Session: 0 min  Stress: Stress Concern Present   Feeling of Stress : To some extent  Social Connections: Socially Isolated   Frequency of Communication with Friends and Family: Never   Frequency of Social Gatherings with Friends and Family: Never   Attends Religious Services: Never   Printmaker: No   Attends Music therapist: Never   Marital Status: Never married  Human resources officer Violence: Not At Risk   Fear of Current or Ex-Partner: No   Emotionally Abused: No   Physically Abused: No   Sexually Abused: No    FAMILY HISTORY:  Family History  Problem Relation Age of Onset   Colon cancer Neg Hx    Colon polyps Neg Hx     CURRENT MEDICATIONS:  Current Outpatient Medications  Medication Sig Dispense Refill   aspirin EC 81 MG tablet Take 81 mg by mouth daily. Swallow whole.     atorvastatin (LIPITOR) 80 MG tablet Take 80 mg by mouth daily.     docusate sodium (COLACE) 100 MG capsule Take 100 mg by mouth 2 (two) times daily.     ferrous sulfate 325 (65 FE) MG tablet 1 tablet twice a day with meals 60 tablet 3   furosemide (LASIX) 20 MG tablet Take 20 mg by mouth daily.      gabapentin (NEURONTIN) 300 MG capsule Take 1 capsule (300 mg total) by mouth 3 (three) times daily. 90 capsule 3   isosorbide mononitrate (IMDUR) 30 MG 24 hr tablet Take 30 mg by mouth daily.     Lidocaine 5 % CREA Apply 1 application topically 4 (four) times daily as needed (to anal  region). 30 g 1   metoprolol succinate (TOPROL-XL) 25 MG 24 hr tablet Take 25 mg by mouth daily.     nitroGLYCERIN (NITROSTAT) 0.4 MG SL tablet Place 0.4 mg under the tongue every 5 (five) minutes x 3 doses as needed for chest pain.  (Patient not taking: Reported on 04/20/2020)     omeprazole (PRILOSEC) 20 MG capsule Take 1 capsule (20 mg total) by mouth 2 (two) times daily before a meal. 60 capsule 1   spironolactone (ALDACTONE) 25 MG tablet Take 25 mg by mouth daily.     tamsulosin (FLOMAX) 0.4 MG CAPS capsule Take 1 capsule (0.4  mg total) by mouth at bedtime. 30 capsule 6   No current facility-administered medications for this visit.    ALLERGIES:  Allergies  Allergen Reactions   Kale Nausea And Vomiting   Collard [place] Sink Virosa]    Other Nausea And Vomiting    Mustard greens, collards, kale, turnip greens, plack eyed peas    PHYSICAL EXAM:  Performance status (ECOG): 1 - Symptomatic but completely ambulatory  Vitals:   08/09/20 1437  BP: 127/87  Pulse: (!) 102  Resp: 18  Temp: (!) 97.1 F (36.2 C)  SpO2: 95%   Wt Readings from Last 3 Encounters:  08/09/20 238 lb (108 kg)  05/10/20 228 lb 1.6 oz (103.5 kg)  04/20/20 226 lb 12.8 oz (102.9 kg)   Physical Exam Vitals reviewed.  Constitutional:      Appearance: Normal appearance. He is obese.  Cardiovascular:     Rate and Rhythm: Normal rate and regular rhythm.     Pulses: Normal pulses.     Heart sounds: Normal heart sounds.  Pulmonary:     Effort: Pulmonary effort is normal.     Breath sounds: Normal breath sounds.  Abdominal:     Palpations: Abdomen is soft. There is no hepatomegaly, splenomegaly or mass.     Tenderness: There is no abdominal tenderness.     Hernia: No hernia is present.  Neurological:     General: No focal deficit present.     Mental Status: He is alert and oriented to person, place, and time.  Psychiatric:        Mood and Affect: Mood normal.        Behavior: Behavior normal.        LABORATORY DATA:  I have reviewed the labs as listed.  CBC Latest Ref Rng & Units 04/03/2020 04/02/2020 04/01/2020  WBC 4.0 - 10.5 K/uL 7.7 6.7 10.8(H)  Hemoglobin 13.0 - 17.0 g/dL 7.9(L) 8.0(L) 7.6(L)  Hematocrit 39 - 52 % 29.5(L) 29.1(L) 27.7(L)  Platelets 150 - 400 K/uL 327 292 310   CMP Latest Ref Rng & Units 05/06/2020 04/03/2020 04/02/2020  Glucose 70 - 99 mg/dL - 138(H) 108(H)  BUN 8 - 23 mg/dL - 5(L) <5(L)  Creatinine 0.61 - 1.24 mg/dL 0.90 0.83 0.88  Sodium 135 - 145 mmol/L - 139 140  Potassium 3.5 - 5.1 mmol/L - 3.5 3.9  Chloride 98 - 111 mmol/L - 104 107  CO2 22 - 32 mmol/L - 27 25  Calcium 8.9 - 10.3 mg/dL - 8.7(L) 8.4(L)  Total Protein 6.5 - 8.1 g/dL - - -  Total Bilirubin 0.3 - 1.2 mg/dL - - -  Alkaline Phos 38 - 126 U/L - - -  AST 15 - 41 U/L - - -  ALT 0 - 44 U/L - - -    DIAGNOSTIC IMAGING:  I have independently reviewed the scans and discussed with the patient. No results found.   ASSESSMENT:  1. T3N0 sigmoid colon adenocarcinoma: -Presentation with on and off rectal bleeding since May 2020. -CT abdomen and pelvis on 02/15/2019 showed wall thickening in the mid sigmoid colon. -Colonoscopy on 11/12/2019 shows a mass in the sigmoid region from 20 cm from anal verge to 28 cm from anal verge,three4 to 5 mm polyps in the proximal rectum. Biopsy of the mass consistent with invasive adenocarcinoma. Polyps were hyperplastic. EGD at the same time was normal. -Sigmoid colon resection on 03/31/2020 shows moderately differentiated invasive adenocarcinoma, margins negative, 0/19 lymph nodes positive, PT3 PN0. Negative for  lymphovascular and perineural invasion. No perforation. MMR is preserved. -Guardant reveal test did not show any evidence of cell free DNA. -CTAP on 05/06/2020 shows wall thickening of the proximal sigmoid colon leading into the anastomosis with no evidence of metastatic disease.  2. Family history: -Paternal grandfather and maternal grandfather,  both died of cancer. Patient does not know the type.   PLAN:  1. Stage II (T3N0) sigmoid colon adenocarcinoma: -No clinical signs or symptoms of recurrence. -Reviewed lab work from 07/27/2020 done [facility]. -Hemoglobin is 11.4 with normal white count and platelet count.  LFTs are normal. -CEA was 0.7.  Ferritin was 31 with normal MCV. -RTC 4 months with repeat CTAP and CEA levels.  2. Urinary hesitancy: -Continue Flomax 0.4 mg at bedtime.  3. Peripheral neuropathy: -He has pins-and-needles sensation in the bottom of the feet from diabetes. -He reports that gabapentin was changed to a different medication.   Orders placed this encounter:  No orders of the defined types were placed in this encounter.    Derek Jack, MD [place] [phone]   I, Milinda Antis, am acting as a scribe for Dr. Sanda Linger.  I, Derek Jack MD, have reviewed the above documentation for accuracy and completeness, and I agree with the above.

## 2020-08-09 NOTE — Patient Instructions (Signed)
Walter Allen at [facility] Discharge Instructions  You were seen today by Dr. Delton Coombes. He went over your recent results. You will be scheduled for a CT scan of your abdomen before your next visit. Dr. Delton Coombes will see you back in 4 months for labs and follow up.   Thank you for choosing [facility] to provide your oncology and hematology care.  To afford each patient quality time with our provider, please arrive at least 15 minutes before your scheduled appointment time.   If you have a lab appointment with the [facility] please come in thru the Main Entrance and check in at the main information desk  You need to re-schedule your appointment should you arrive 10 or more minutes late.  We strive to give you quality time with our providers, and arriving late affects you and other patients whose appointments are after yours.  Also, if you no show three or more times for appointments you may be dismissed from the clinic at the providers discretion.     Again, thank you for choosing [facility].  Our hope is that these requests will decrease the amount of time that you wait before being seen by our physicians.       _____________________________________________________________  Should you have questions after your visit to [facility], please contact our office at [phone] between the hours of 8:00 a.m. and 4:30 p.m.  Voicemails left after 4:00 p.m. will not be returned until the following business day.  For prescription refill requests, have your pharmacy contact our office and allow 72 hours.    Cancer Center Support Programs:   > Cancer Support Group  2nd Tuesday of the month 1pm-2pm, Journey Room

## 2020-10-11 ENCOUNTER — Encounter (HOSPITAL_COMMUNITY): Payer: Self-pay | Admitting: *Deleted

## 2020-10-11 NOTE — Progress Notes (Signed)
I reached out to Nurse Sheralyn Boatman with the [facility] facility.  She updated me on patient's current location.  He is now at [facility].  Nurse Nyoka Cowden is the nurse at that facility and her contact number is [phone].  She has messaged Ms. Green about patient needing either a follow up here at [facility], or confirmation that patient is receiving care at another cancer facility.  We will await confirmation.

## 2020-10-20 ENCOUNTER — Encounter (HOSPITAL_COMMUNITY): Payer: Self-pay | Admitting: *Deleted

## 2020-10-20 NOTE — Progress Notes (Signed)
I had not heard back from the correctional facility about patient transferring his care to another cancer center.  I called and spoke with Officer Ms. Hunt at [facility] and she took all of the information and then had me fax the requested appts and office notes to the scheduler Ms. Herrick.    I spoke with Ms. Herrick and she states that she will schedule the appointments here at [facility] at this time.  If the medical doctor with correctional facility deems necessary that patient transfer his care, they will call us for his medical record.    Office notes and upcoming appointments faxed.

## 2020-10-21 ENCOUNTER — Encounter (HOSPITAL_COMMUNITY): Payer: Self-pay | Admitting: *Deleted

## 2020-10-21 NOTE — Progress Notes (Signed)
Request received from the [facility] for medical records to transfer care to another cancer center close to his place of detainment.  Office notes, labs, radiology results and copy of radiologic exams mailed to:  [facility] [id number] [place] [address]

## 2020-12-07 ENCOUNTER — Ambulatory Visit (HOSPITAL_COMMUNITY): Admission: RE | Admit: 2020-12-07 | Source: Ambulatory Visit

## 2020-12-07 ENCOUNTER — Other Ambulatory Visit (HOSPITAL_COMMUNITY)

## 2020-12-13 ENCOUNTER — Ambulatory Visit (HOSPITAL_COMMUNITY): Admitting: Hematology

## 2021-01-21 IMAGING — CT CT ABDOMEN AND PELVIS WITH CONTRAST
2 of 5 series · 16 of 46 positions shown, 18 images · IV contrast (Isovue)
Comparison: None.

CLINICAL DATA: Lower abdominal pain and nausea beginning today.

EXAM:
CT ABDOMEN AND PELVIS WITH CONTRAST
TECHNIQUE: Multidetector CT imaging of the abdomen and pelvis was performed
using the standard protocol following bolus administration of
intravenous contrast.
CONTRAST:  100mL OMNIPAQUE IOHEXOL 300 MG/ML  SOLN

[Series 2: axial st · axial · 0.85mm/px · z∈[+913,+1333]mm · 13 of 100 slices shown, 15 images]
[im 8/100  soft-tissue]
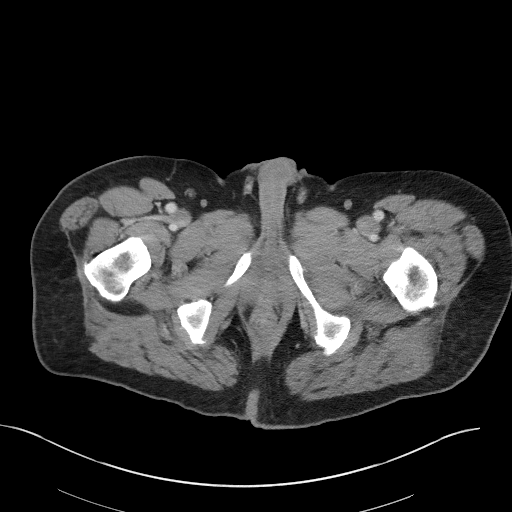
[im 8/100  bone]
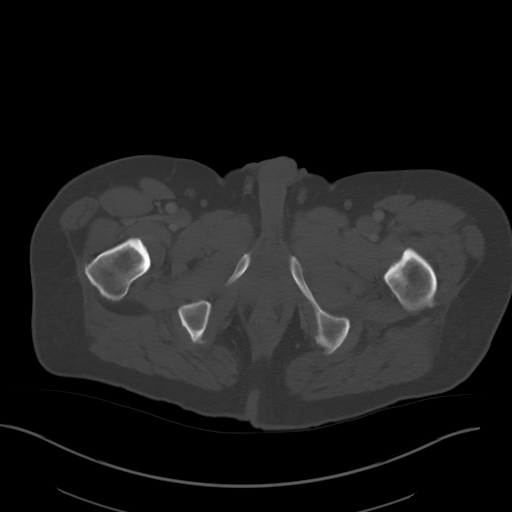
[im 15/100  soft-tissue]
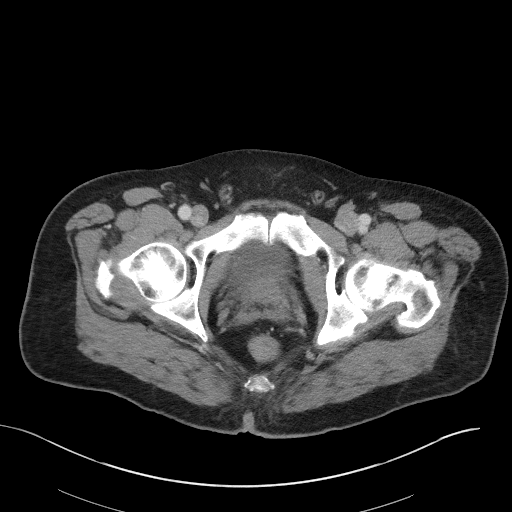
[im 22/100  soft-tissue]
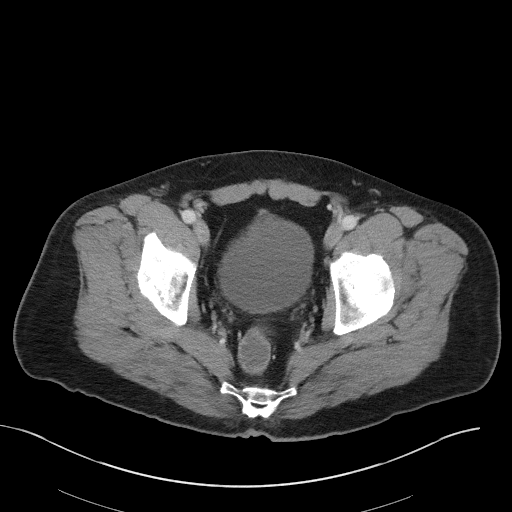
[im 29/100  soft-tissue]
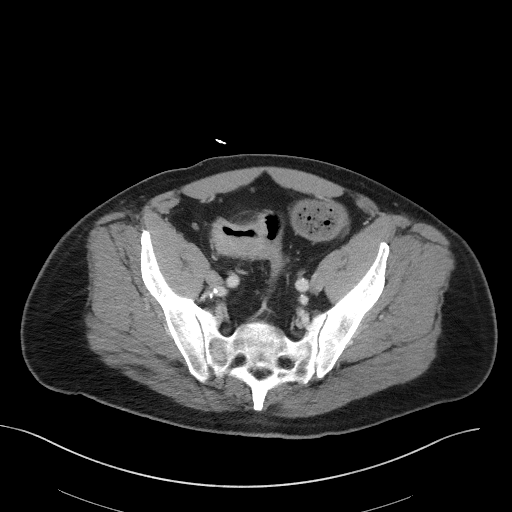
[im 36/100  soft-tissue]
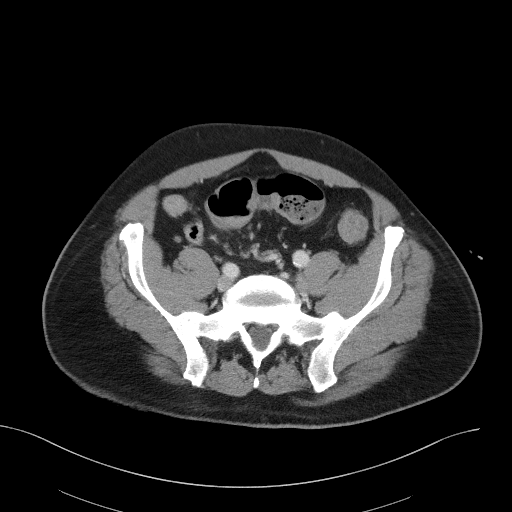
[im 43/100  soft-tissue]
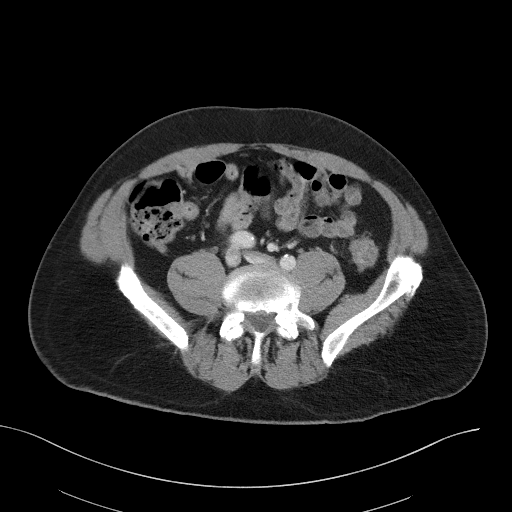
[im 50/100  soft-tissue]
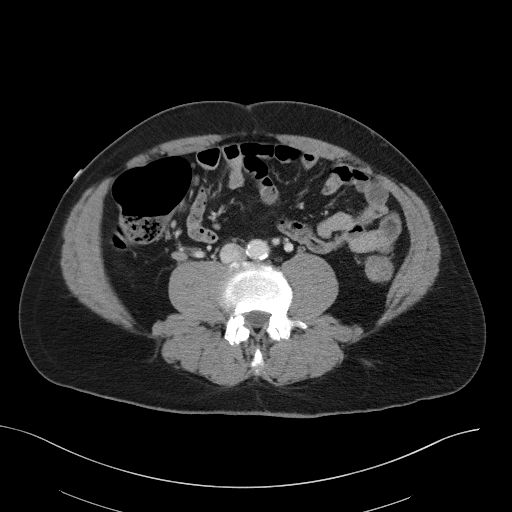
[im 57/100  soft-tissue]
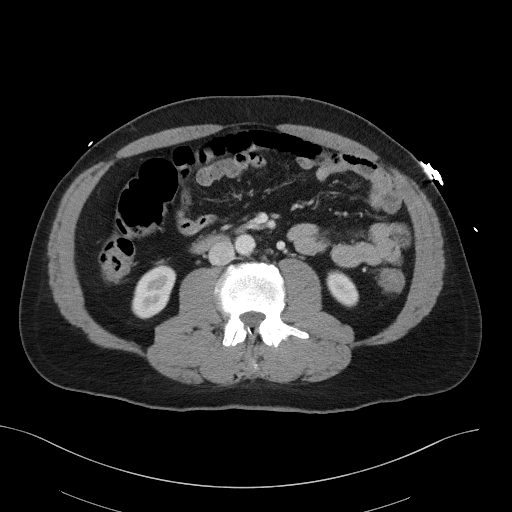
[im 64/100  soft-tissue]
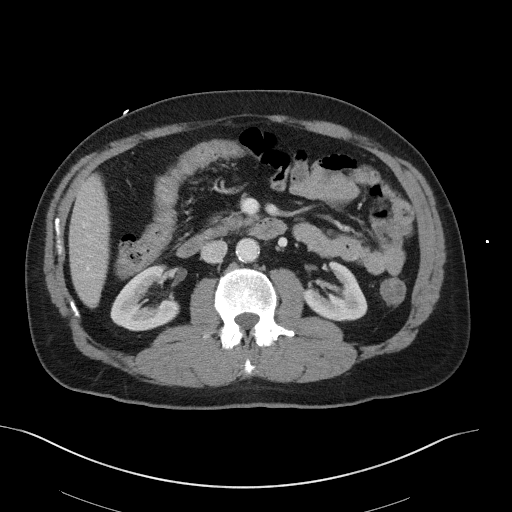
[im 64/100  bone]
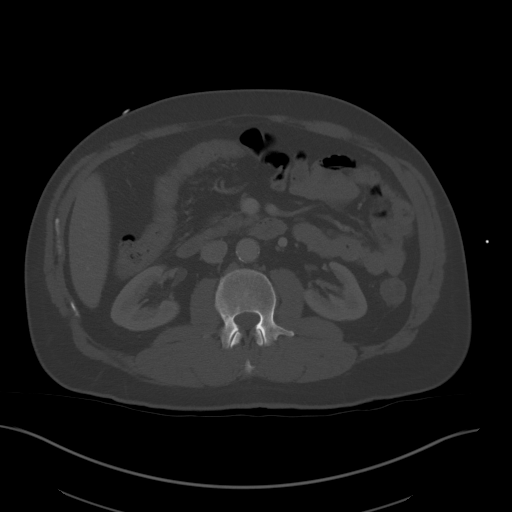
[im 71/100  soft-tissue]
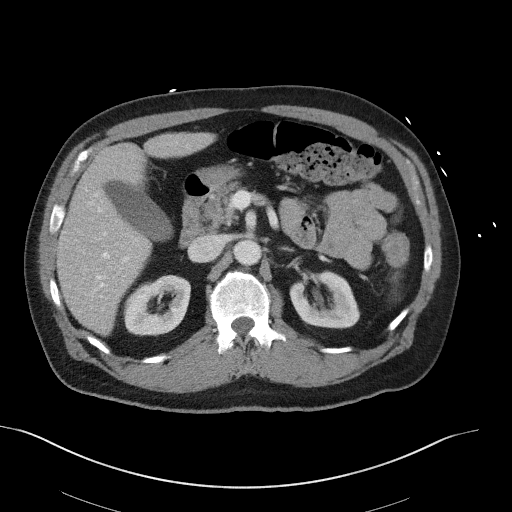
[im 78/100  soft-tissue]
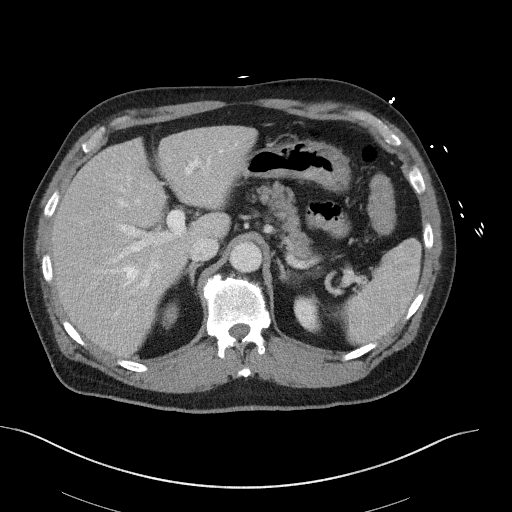
[im 85/100  soft-tissue]
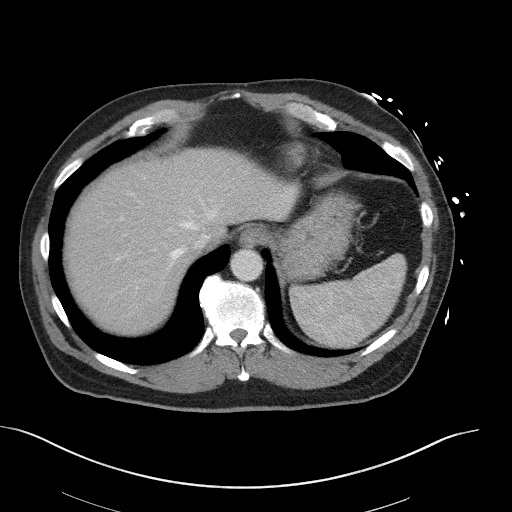
[im 92/100  soft-tissue]
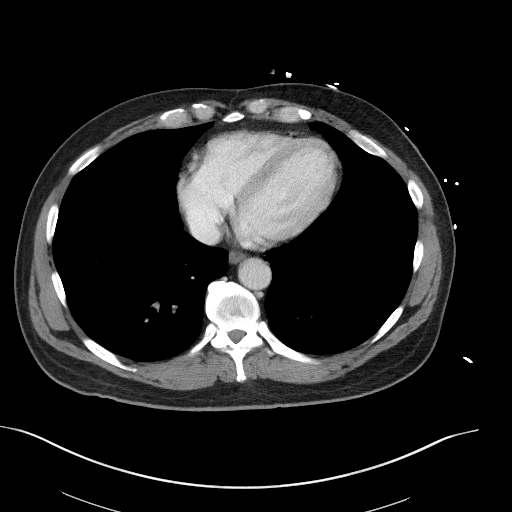

[Series 6: coronal st · coronal · 0.84mm/px · 3 of 102 slices shown]
[im 34/102  soft-tissue]
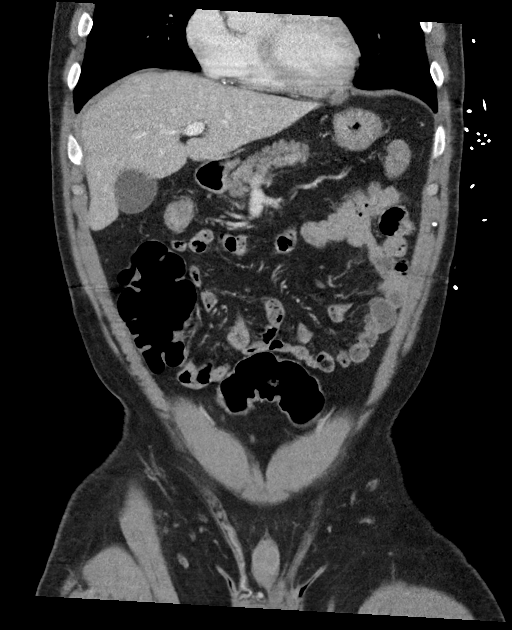
[im 45/102  soft-tissue]
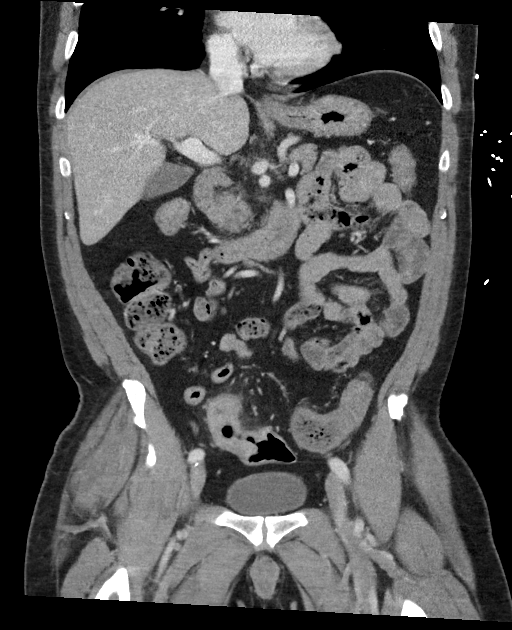
[im 57/102  soft-tissue]
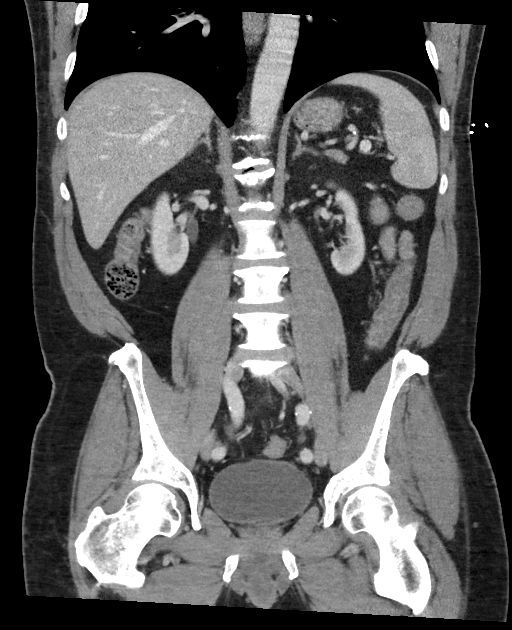

[16 of 46 positions shown; findings below may reference images not displayed]

FINDINGS: Lower Chest: No acute findings.

Hepatobiliary: No hepatic masses identified. Gallbladder is
unremarkable.

Pancreas:  No mass or inflammatory changes.

Spleen: Within normal limits in size and appearance.

Adrenals/Urinary Tract: No masses identified. No evidence of
hydronephrosis.

Stomach/Bowel: A short-segment area of wall thickening is seen in
the mid sigmoid colon with minimal hazy opacity in the adjacent
pericolonic fat. This could represent sigmoid diverticulitis or
colon carcinoma. No evidence of abscess or bowel obstruction.

Vascular/Lymphatic: No pathologically enlarged lymph nodes. No
abdominal aortic aneurysm. Aortic atherosclerosis.

Reproductive:  No mass or other significant abnormality.

Other:  None.

Musculoskeletal:  No suspicious bone lesions identified.
IMPRESSION: 1. Short-segment wall thickening in mid sigmoid colon, with minimal
pericolonic hazy opacity. Differential diagnosis includes sigmoid
diverticulitis and colon carcinoma. Consider further evaluation with
colonoscopy or short-term follow-up CT in 3-4 weeks.
2. No evidence of abscess or bowel obstruction.

Aortic Atherosclerosis ([id number]-GR0.0).

## 2022-04-12 IMAGING — CT CT ABD-PELV W/ CM
2 of 5 series · 13 of 36 positions shown, 16 images · IV contrast (Omnipaque or Isovue)
Comparison: 02/15/2019 CT scan

CLINICAL DATA: Partial colectomy for colon cancer on 03/31/2020,
restaging assessment

EXAM:
CT CHEST, ABDOMEN, AND PELVIS WITH CONTRAST
TECHNIQUE: Multidetector CT imaging of the chest, abdomen and pelvis was
performed following the standard protocol during bolus
administration of intravenous contrast.
CONTRAST:  100mL OMNIPAQUE IOHEXOL 300 MG/ML  SOLN

[Series 2: cap with · axial · 0.96mm/px · z∈[+700,+1310]mm · 10 of 148 slices shown, 13 images]
[im 13/148  mediastinal]
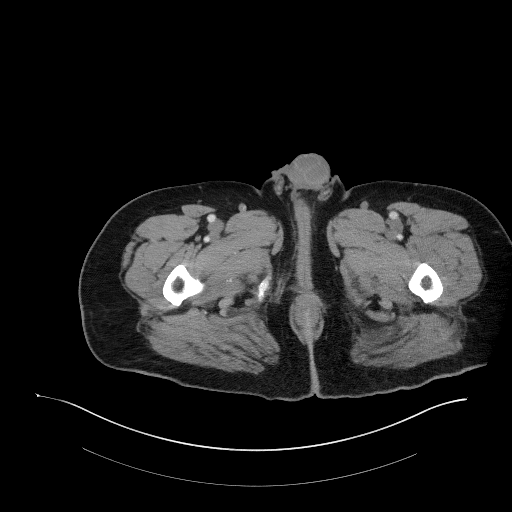
[im 13/148  lung]
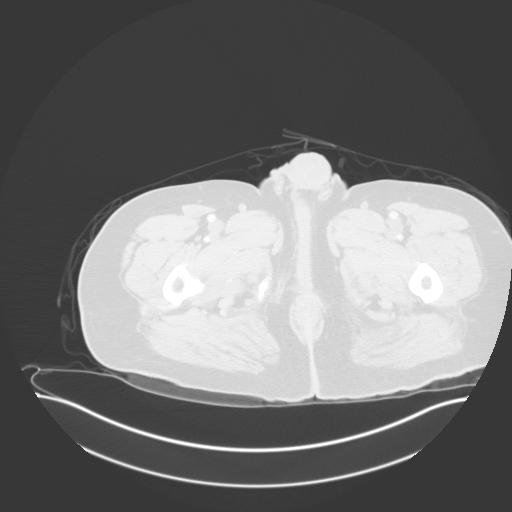
[im 25/148  lung]
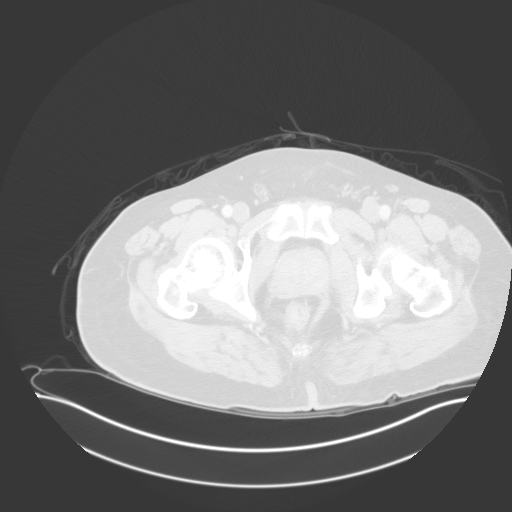
[im 37/148  lung]
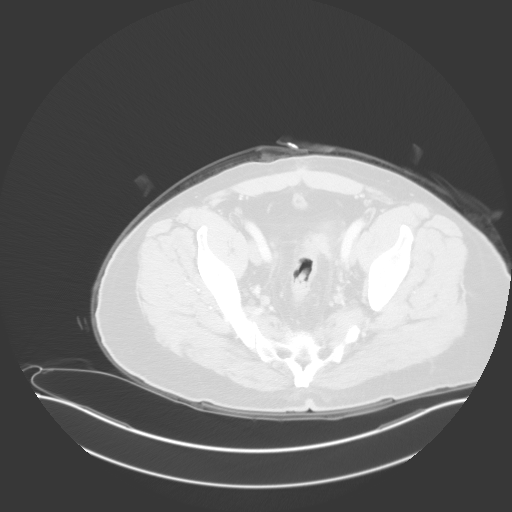
[im 50/148  lung]
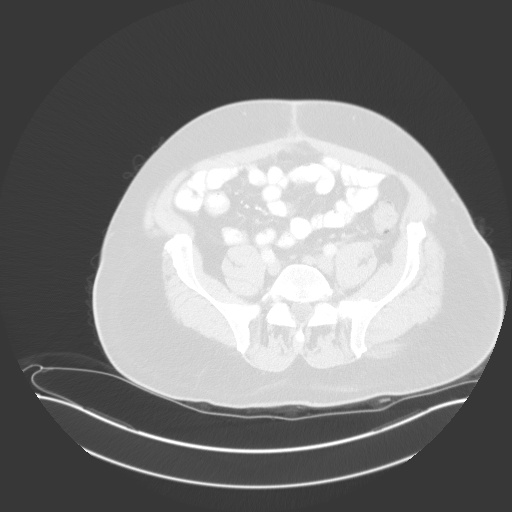
[im 62/148  mediastinal]
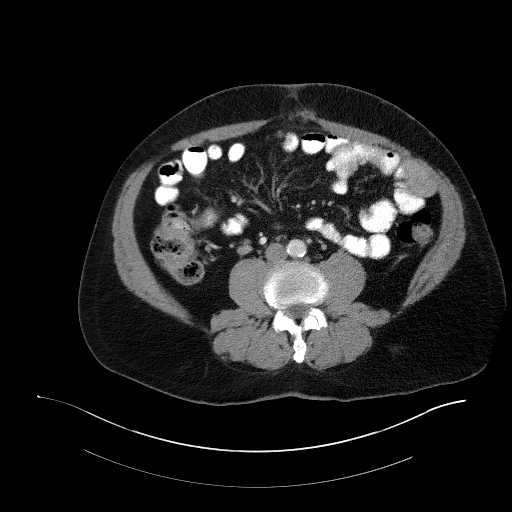
[im 62/148  lung]
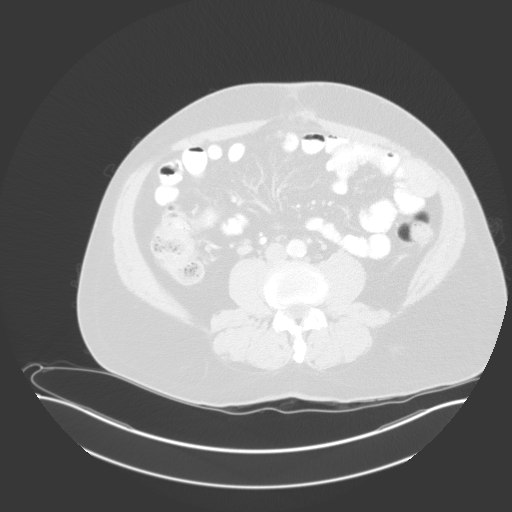
[im 86/148  lung]
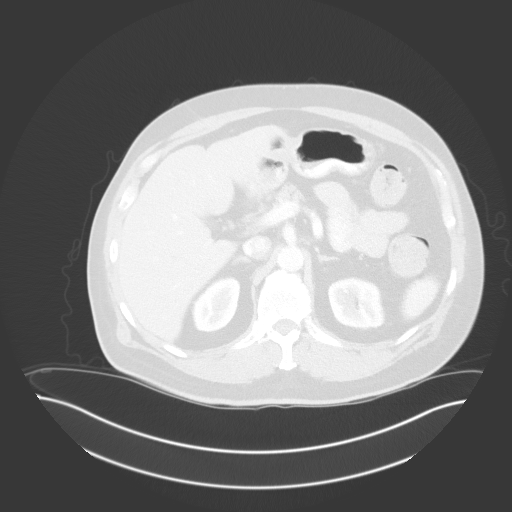
[im 99/148  lung]
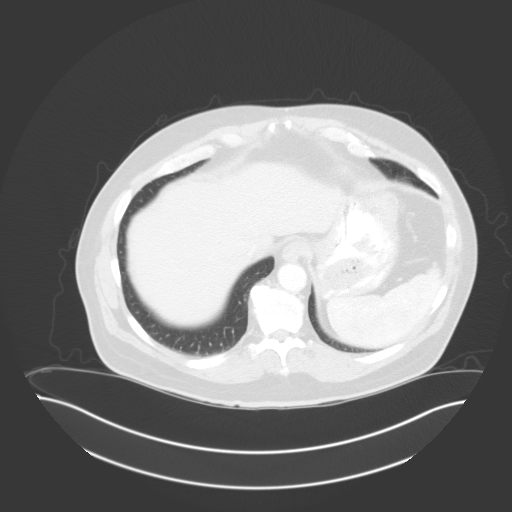
[im 111/148  lung]
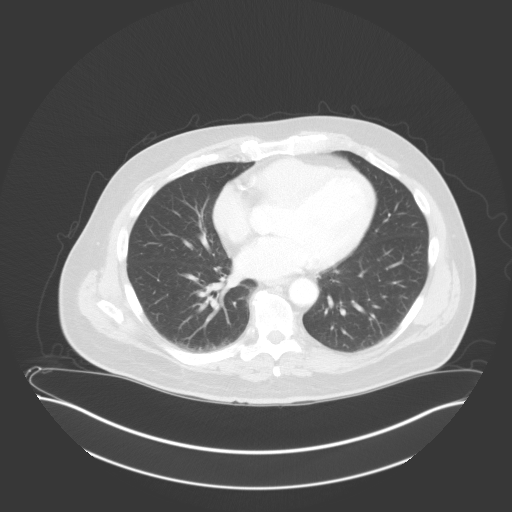
[im 123/148  mediastinal]
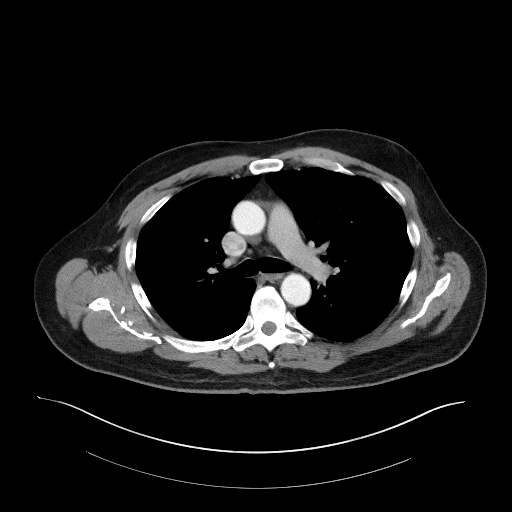
[im 123/148  lung]
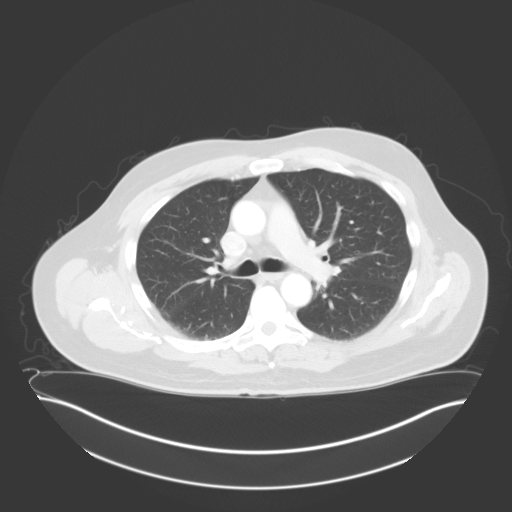
[im 135/148  lung]
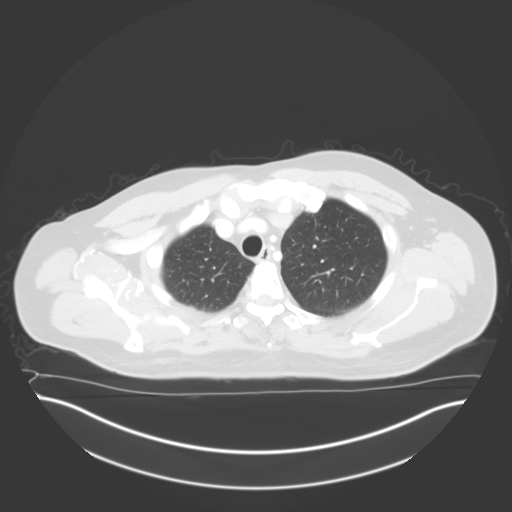

[Series 4: coronals · coronal · 0.98mm/px · 3 of 190 slices shown]
[im 38/190  lung]
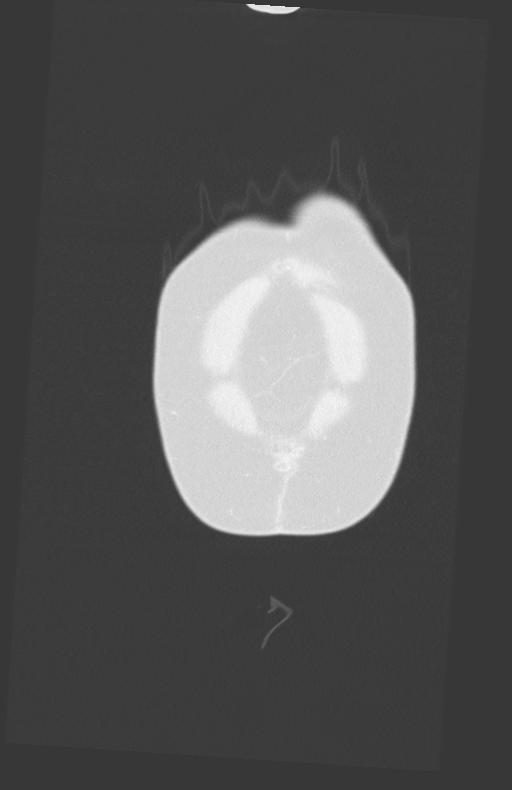
[im 76/190  lung]
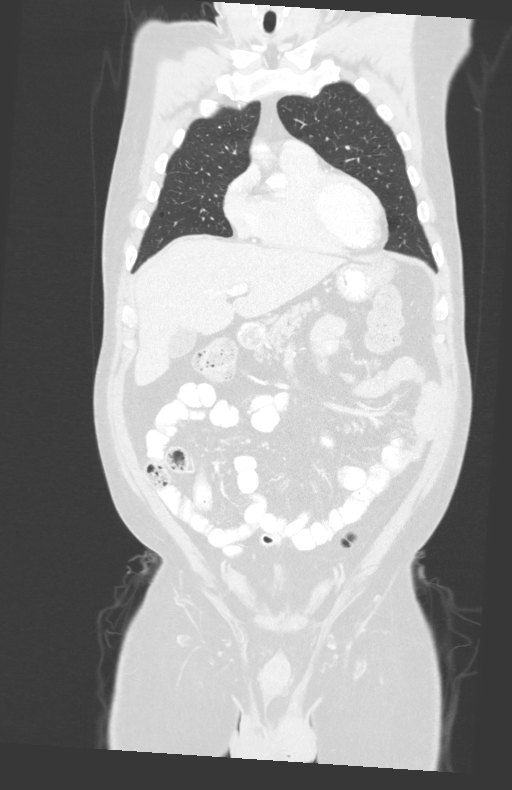
[im 114/190  lung]
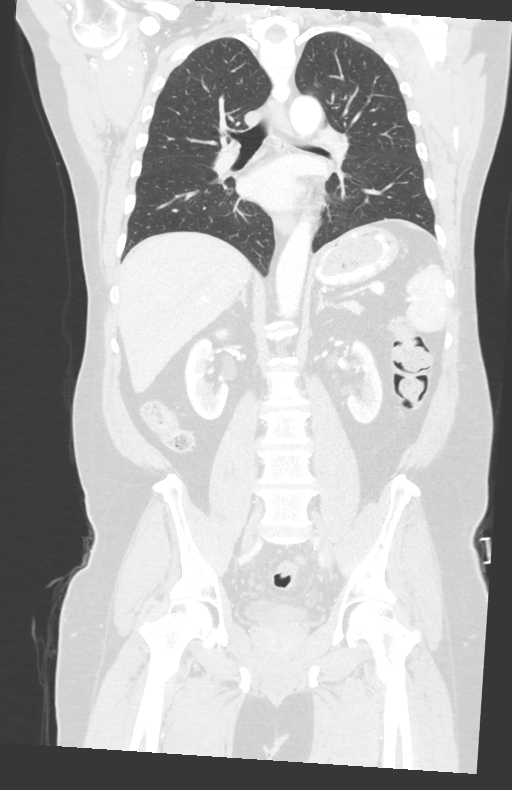

[13 of 36 positions shown; findings below may reference images not displayed]

FINDINGS: CT CHEST FINDINGS

Cardiovascular: Coronary, aortic arch, and branch vessel
atherosclerotic vascular disease.

Mediastinum/Nodes: Right hilar lymph node 0.7 cm in short axis on
image 86/4. No pathologic adenopathy.

Lungs/Pleura: Calcified granuloma in the right upper lobe on image
41/3 measuring 0.5 cm in long axis. 3 mm calcified granuloma in the
right upper lobe anteriorly on image 62/3. 2 mm calcified granuloma
in the lingula on image 100/3.

Musculoskeletal: Lower thoracic spondylosis.

CT ABDOMEN PELVIS FINDINGS

Hepatobiliary: Unremarkable. No findings of metastatic disease to
the liver. The gallbladder appears unremarkable.

Pancreas: Unremarkable

Spleen: Unremarkable

Adrenals/Urinary Tract: Unremarkable

Stomach/Bowel: The segment of sigmoid colon previously containing
the mass has been resected. Wall thickening of the proximal sigmoid
colon leading into the anastomosis with surrounding inflammatory
stranding, much of which may simply represent expected postoperative
appearance. I do not see extraluminal fluid collection or
extraluminal gas to suggest abscess or breakdown.

Vascular/Lymphatic: Aortoiliac atherosclerotic vascular disease.
Ileocolic node 1.0 cm in short axis on image 86/2, previously
cm, merit surveillance. No new or overtly pathologic adenopathy
identified.

Reproductive: Mild prostatomegaly.

Other: No supplemental non-categorized findings.

Musculoskeletal: Bridging spurring of both sacroiliac joints
superiorly.

Lower lumbar spondylosis and degenerative disc disease, suspected
mild deviation of the right L3 nerve in the lateral extraforaminal
space due to intervertebral spurring.
IMPRESSION: 1. Interval resection of the segment of sigmoid colon previously
containing the mass. Wall thickening of the proximal sigmoid colon
leading into the anastomosis with surrounding inflammatory
stranding, much of which may simply represent expected postoperative
appearance. No extraluminal fluid collection or extraluminal gas to
suggest abscess or breakdown.
2. Other imaging findings of potential clinical significance:
Coronary atherosclerosis. Old granulomatous disease. Mild
prostatomegaly. Lower lumbar spondylosis and degenerative disc
disease, suspected mild deviation of the right L3 nerve in the
lateral extraforaminal space due to intervertebral spurring.
3. Aortic atherosclerosis.

Aortic Atherosclerosis ([id number]-34V.V).
